# Patient Record
Sex: Female | Born: 1935 | Race: White | Hispanic: No | Smoking: Never smoker
Health system: Southern US, Community
[De-identification: ages and names within clinical notes are randomized; demographics above are authoritative.]

## PROBLEM LIST (undated history)

## (undated) DIAGNOSIS — C73 Malignant neoplasm of thyroid gland: Secondary | ICD-10-CM

## (undated) DIAGNOSIS — I351 Nonrheumatic aortic (valve) insufficiency: Secondary | ICD-10-CM

## (undated) DIAGNOSIS — K219 Gastro-esophageal reflux disease without esophagitis: Secondary | ICD-10-CM

## (undated) DIAGNOSIS — I10 Essential (primary) hypertension: Secondary | ICD-10-CM

## (undated) DIAGNOSIS — A0472 Enterocolitis due to Clostridium difficile, not specified as recurrent: Secondary | ICD-10-CM

## (undated) DIAGNOSIS — E039 Hypothyroidism, unspecified: Secondary | ICD-10-CM

## (undated) DIAGNOSIS — R55 Syncope and collapse: Secondary | ICD-10-CM

## (undated) DIAGNOSIS — F79 Unspecified intellectual disabilities: Secondary | ICD-10-CM

## (undated) DIAGNOSIS — K5090 Crohn's disease, unspecified, without complications: Secondary | ICD-10-CM

## (undated) DIAGNOSIS — B9620 Unspecified Escherichia coli [E. coli] as the cause of diseases classified elsewhere: Secondary | ICD-10-CM

## (undated) DIAGNOSIS — R7881 Bacteremia: Secondary | ICD-10-CM

## (undated) HISTORY — PX: THYROIDECTOMY: SHX17

## (undated) HISTORY — PX: TUBAL LIGATION: SHX77

## (undated) HISTORY — DX: Malignant neoplasm of thyroid gland: C73

## (undated) HISTORY — DX: Enterocolitis due to Clostridium difficile, not specified as recurrent: A04.72

## (undated) HISTORY — DX: Unspecified Escherichia coli (E. coli) as the cause of diseases classified elsewhere: B96.20

## (undated) HISTORY — PX: CHOLECYSTECTOMY: SHX55

## (undated) HISTORY — DX: Hypothyroidism, unspecified: E03.9

## (undated) HISTORY — DX: Unspecified intellectual disabilities: F79

## (undated) HISTORY — DX: Crohn's disease, unspecified, without complications: K50.90

## (undated) HISTORY — DX: Bacteremia: R78.81

## (undated) HISTORY — DX: Gastro-esophageal reflux disease without esophagitis: K21.9

## (undated) HISTORY — PX: OTHER SURGICAL HISTORY: SHX169

## (undated) HISTORY — DX: Nonrheumatic aortic (valve) insufficiency: I35.1

## (undated) HISTORY — PX: APPENDECTOMY: SHX54

## (undated) HISTORY — DX: Syncope and collapse: R55

## (undated) HISTORY — DX: Essential (primary) hypertension: I10

---

## 2000-03-05 ENCOUNTER — Encounter: Admission: RE | Admit: 2000-03-05 | Discharge: 2000-03-05 | Payer: Self-pay | Admitting: Family Medicine

## 2000-03-05 ENCOUNTER — Encounter: Payer: Self-pay | Admitting: Family Medicine

## 2000-04-24 ENCOUNTER — Encounter (INDEPENDENT_AMBULATORY_CARE_PROVIDER_SITE_OTHER): Payer: Self-pay | Admitting: *Deleted

## 2000-04-24 ENCOUNTER — Inpatient Hospital Stay (HOSPITAL_COMMUNITY): Admission: EM | Admit: 2000-04-24 | Discharge: 2000-04-28 | Payer: Self-pay | Admitting: Emergency Medicine

## 2000-04-25 ENCOUNTER — Encounter: Payer: Self-pay | Admitting: Emergency Medicine

## 2000-04-26 ENCOUNTER — Encounter: Payer: Self-pay | Admitting: Endocrinology

## 2000-04-27 ENCOUNTER — Encounter: Payer: Self-pay | Admitting: Gastroenterology

## 2000-07-02 ENCOUNTER — Encounter: Payer: Self-pay | Admitting: General Surgery

## 2000-07-02 ENCOUNTER — Encounter: Admission: RE | Admit: 2000-07-02 | Discharge: 2000-07-02 | Payer: Self-pay | Admitting: General Surgery

## 2000-07-19 ENCOUNTER — Other Ambulatory Visit: Admission: RE | Admit: 2000-07-19 | Discharge: 2000-07-19 | Payer: Self-pay | Admitting: *Deleted

## 2000-09-14 ENCOUNTER — Encounter: Payer: Self-pay | Admitting: General Surgery

## 2000-09-17 ENCOUNTER — Encounter (INDEPENDENT_AMBULATORY_CARE_PROVIDER_SITE_OTHER): Payer: Self-pay | Admitting: Specialist

## 2000-09-17 ENCOUNTER — Observation Stay (HOSPITAL_COMMUNITY): Admission: RE | Admit: 2000-09-17 | Discharge: 2000-09-18 | Payer: Self-pay

## 2000-10-25 ENCOUNTER — Other Ambulatory Visit: Admission: RE | Admit: 2000-10-25 | Discharge: 2000-10-25 | Payer: Self-pay | Admitting: *Deleted

## 2001-03-04 ENCOUNTER — Other Ambulatory Visit: Admission: RE | Admit: 2001-03-04 | Discharge: 2001-03-04 | Payer: Self-pay | Admitting: Obstetrics and Gynecology

## 2001-05-28 ENCOUNTER — Encounter (HOSPITAL_COMMUNITY): Admission: RE | Admit: 2001-05-28 | Discharge: 2001-08-26 | Payer: Self-pay | Admitting: Gastroenterology

## 2001-06-03 ENCOUNTER — Ambulatory Visit (HOSPITAL_COMMUNITY): Admission: RE | Admit: 2001-06-03 | Discharge: 2001-06-03 | Payer: Self-pay | Admitting: Gastroenterology

## 2001-07-04 ENCOUNTER — Other Ambulatory Visit: Admission: RE | Admit: 2001-07-04 | Discharge: 2001-07-04 | Payer: Self-pay | Admitting: Obstetrics and Gynecology

## 2001-12-18 ENCOUNTER — Encounter (HOSPITAL_COMMUNITY): Admission: RE | Admit: 2001-12-18 | Discharge: 2002-03-18 | Payer: Self-pay | Admitting: Internal Medicine

## 2002-02-26 ENCOUNTER — Encounter (HOSPITAL_COMMUNITY): Admission: RE | Admit: 2002-02-26 | Discharge: 2002-05-27 | Payer: Self-pay | Admitting: Gastroenterology

## 2003-09-02 ENCOUNTER — Emergency Department (HOSPITAL_COMMUNITY): Admission: EM | Admit: 2003-09-02 | Discharge: 2003-09-02 | Payer: Self-pay | Admitting: Emergency Medicine

## 2003-12-01 ENCOUNTER — Encounter (HOSPITAL_COMMUNITY): Admission: RE | Admit: 2003-12-01 | Discharge: 2003-12-02 | Payer: Self-pay | Admitting: Internal Medicine

## 2004-08-01 ENCOUNTER — Ambulatory Visit: Payer: Self-pay | Admitting: Internal Medicine

## 2004-08-01 ENCOUNTER — Ambulatory Visit (HOSPITAL_COMMUNITY): Admission: RE | Admit: 2004-08-01 | Discharge: 2004-08-01 | Payer: Self-pay | Admitting: Internal Medicine

## 2004-11-11 ENCOUNTER — Ambulatory Visit: Payer: Self-pay | Admitting: Internal Medicine

## 2004-11-14 ENCOUNTER — Encounter (HOSPITAL_COMMUNITY): Admission: RE | Admit: 2004-11-14 | Discharge: 2005-02-12 | Payer: Self-pay | Admitting: Internal Medicine

## 2007-08-22 DIAGNOSIS — R55 Syncope and collapse: Secondary | ICD-10-CM

## 2007-08-22 HISTORY — DX: Syncope and collapse: R55

## 2007-10-11 ENCOUNTER — Emergency Department: Payer: Self-pay | Admitting: Emergency Medicine

## 2007-10-11 ENCOUNTER — Other Ambulatory Visit: Payer: Self-pay

## 2007-12-16 ENCOUNTER — Inpatient Hospital Stay (HOSPITAL_COMMUNITY): Admission: EM | Admit: 2007-12-16 | Discharge: 2007-12-27 | Payer: Self-pay | Admitting: Emergency Medicine

## 2007-12-16 ENCOUNTER — Ambulatory Visit: Payer: Self-pay | Admitting: Pulmonary Disease

## 2008-01-30 ENCOUNTER — Ambulatory Visit (HOSPITAL_COMMUNITY): Admission: RE | Admit: 2008-01-30 | Discharge: 2008-01-30 | Payer: Self-pay | Admitting: Internal Medicine

## 2008-12-29 ENCOUNTER — Encounter: Payer: Self-pay | Admitting: Cardiovascular Disease

## 2009-02-19 ENCOUNTER — Ambulatory Visit (HOSPITAL_COMMUNITY): Admission: RE | Admit: 2009-02-19 | Discharge: 2009-02-19 | Payer: Self-pay | Admitting: Internal Medicine

## 2009-08-27 DIAGNOSIS — A0472 Enterocolitis due to Clostridium difficile, not specified as recurrent: Secondary | ICD-10-CM

## 2009-08-27 DIAGNOSIS — I10 Essential (primary) hypertension: Secondary | ICD-10-CM | POA: Insufficient documentation

## 2009-08-27 DIAGNOSIS — Z8669 Personal history of other diseases of the nervous system and sense organs: Secondary | ICD-10-CM

## 2009-08-27 DIAGNOSIS — R5381 Other malaise: Secondary | ICD-10-CM | POA: Insufficient documentation

## 2009-08-27 DIAGNOSIS — R1310 Dysphagia, unspecified: Secondary | ICD-10-CM | POA: Insufficient documentation

## 2009-08-28 IMAGING — CR DG CHEST 1V PORT
1 series · 1 of 1 positions shown · non-contrast
Comparison: Earlier exam today at to 0833 hours.

CLINICAL DATA: Sepsis/UTI/pneumonia.  Follow up left lung opacity.

PORTABLE CHEST - 1 VIEW

[view not recorded]
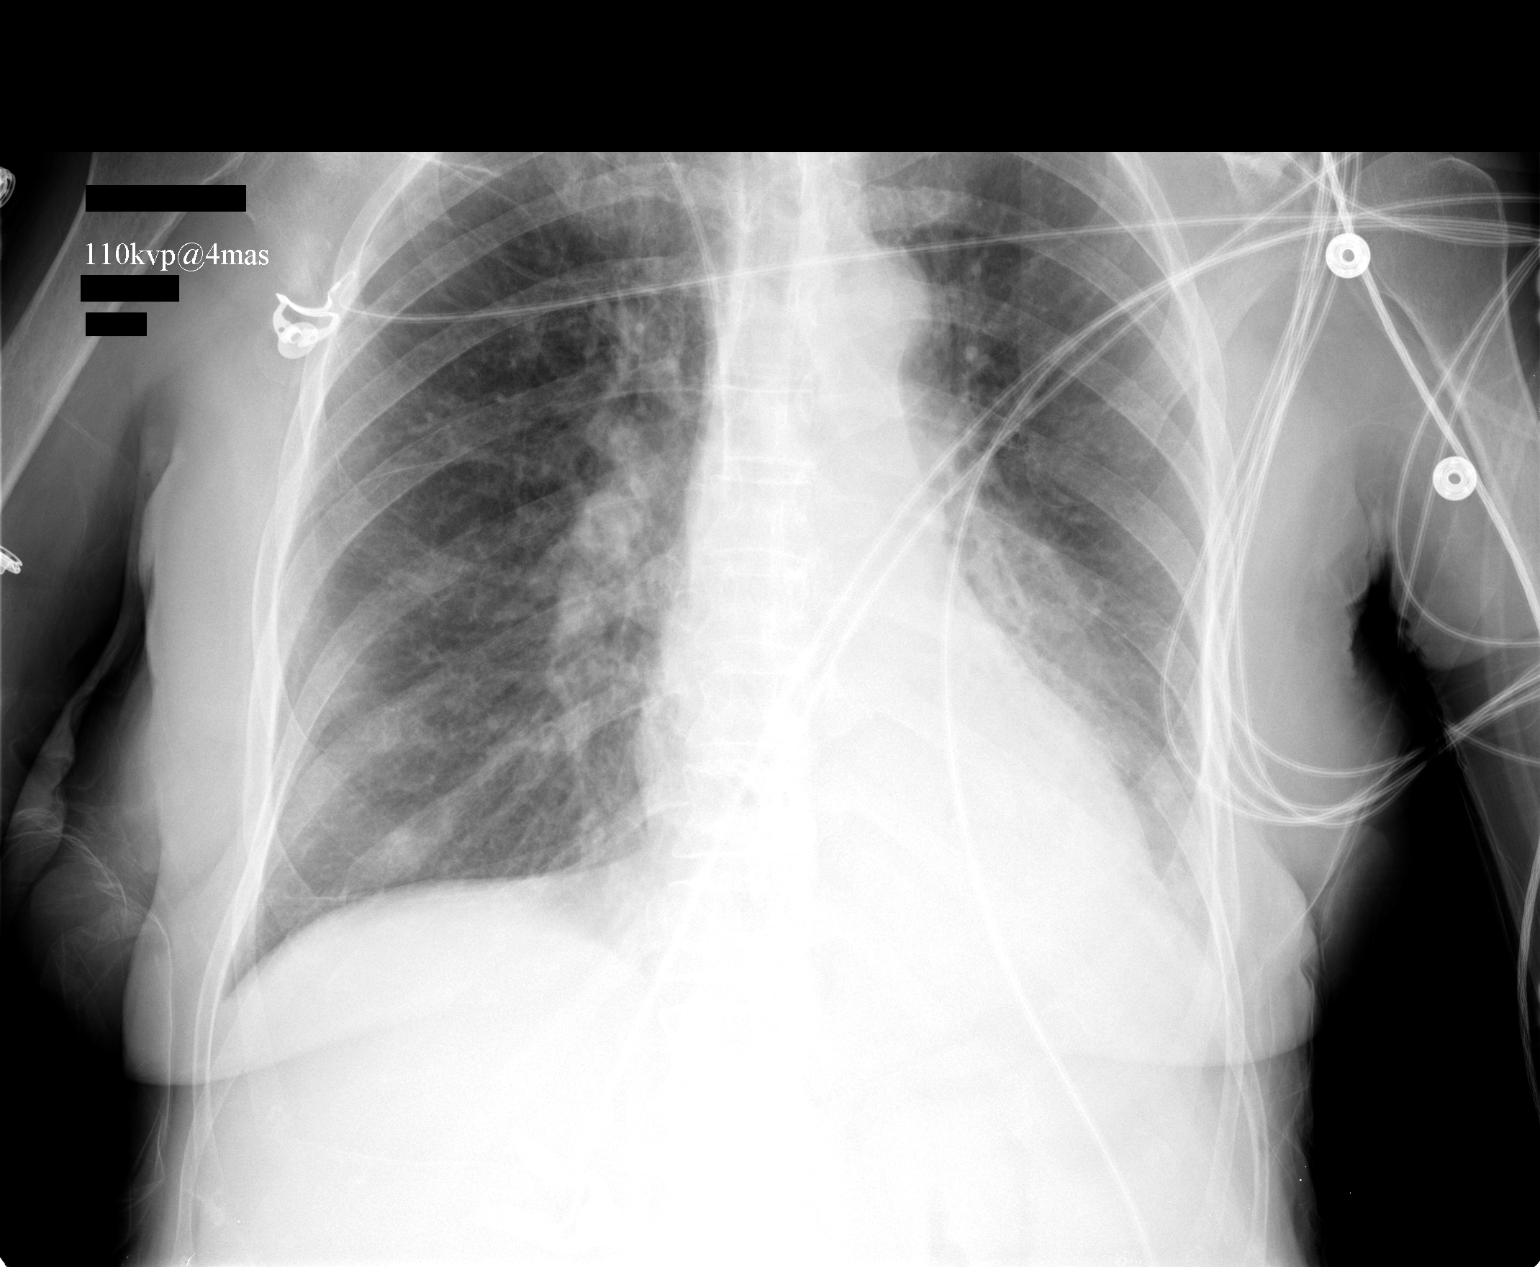

[1 of 1 positions shown; findings below may reference images not displayed]

FINDINGS: Satisfactory ET tube position.  Right jugular CVC is in
the mid SVC.  Suggestion of some improvement in left upper lung
zone aeration.  Density is noted in the left lower lobe compatible
with atelectasis / pneumonic consolidation.  Chronic bronchitic
markings.
IMPRESSION: Left lower lobe atelectasis / consolidation persist.  Some
improvement in left upper lung zone aeration.

## 2009-08-28 IMAGING — CR DG CHEST 1V PORT
1 series · 1 of 1 positions shown · non-contrast
Comparison: 12/16/2007 at 0288 hours

CLINICAL DATA: ET tube repositioned

PORTABLE CHEST - 1 VIEW

[view not recorded]
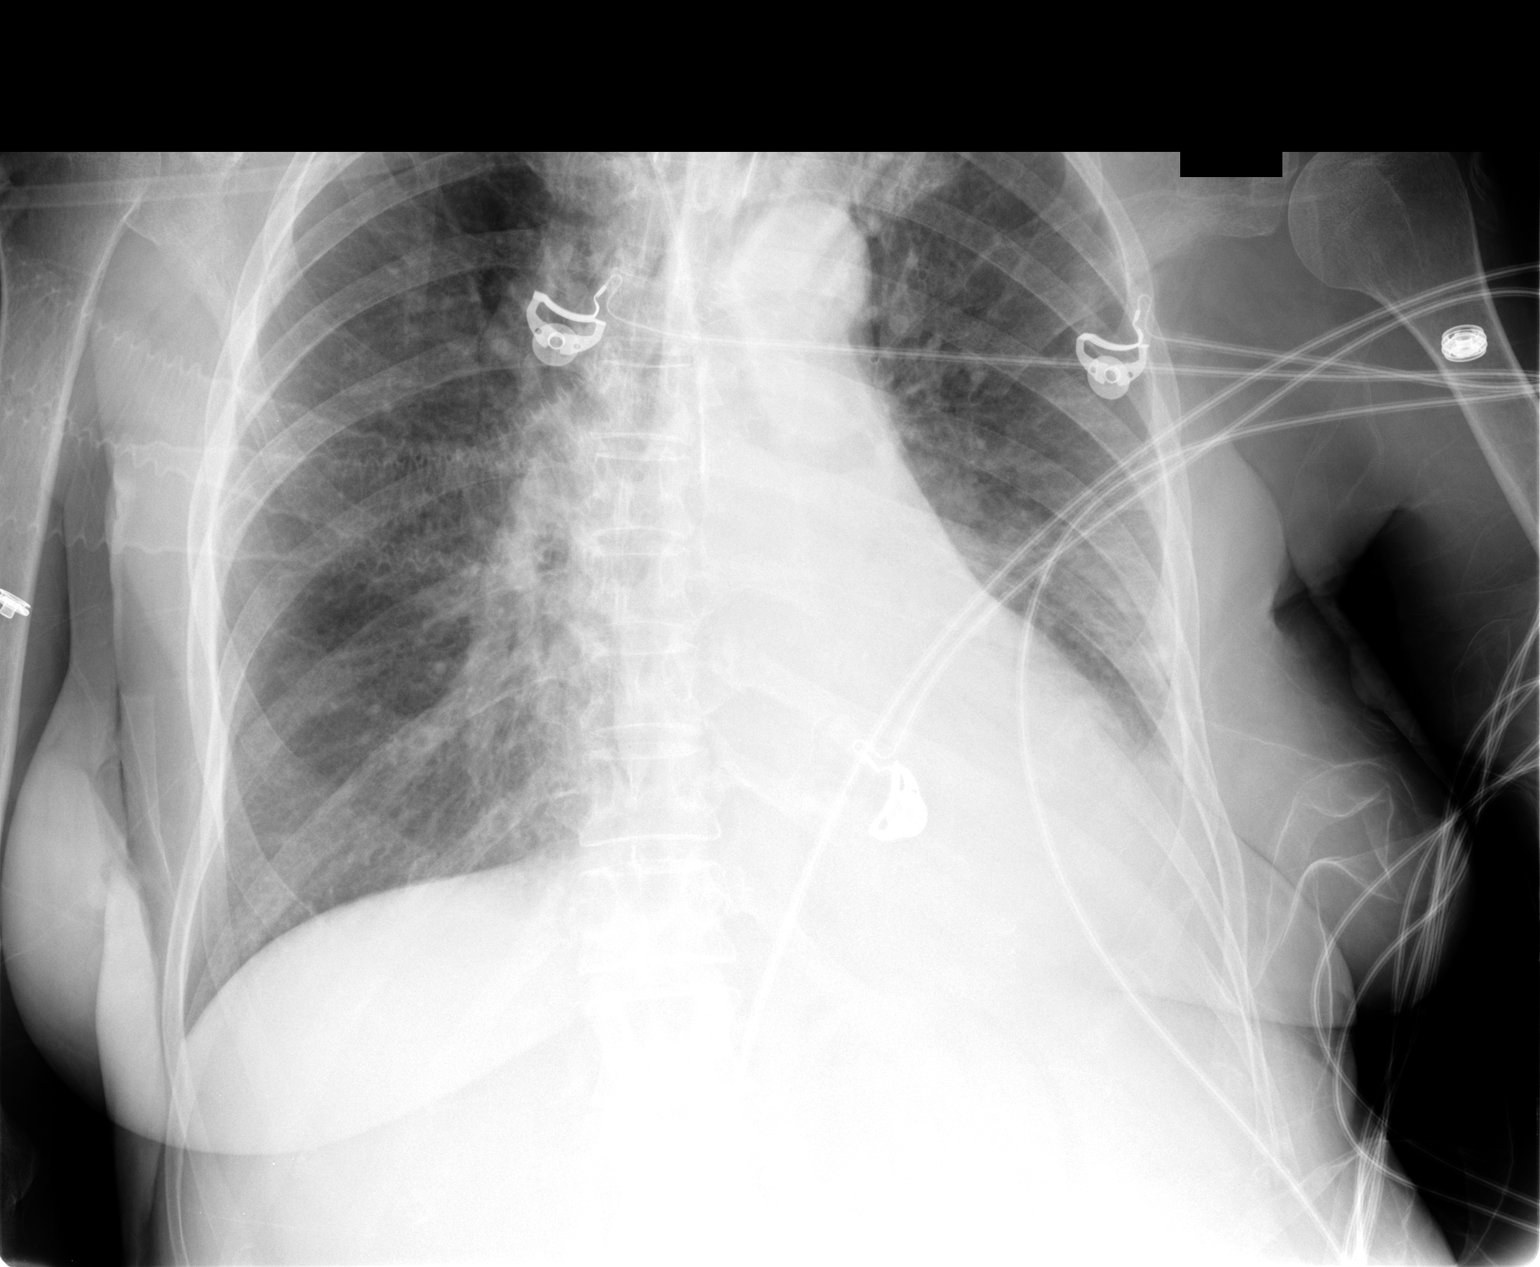

[1 of 1 positions shown; findings below may reference images not displayed]

FINDINGS: ET tube approximately 4 cm above the carina.  The patient
is rotated to the left but there is persistent left lower lobe
atelectasis or atelectatic pneumonia.

No new findings.
IMPRESSION: 1.  ET tube 4 cm above the carina.
2.  Persistent left lower lobe atelectasis or pneumonia.

## 2009-09-02 ENCOUNTER — Ambulatory Visit: Payer: Self-pay | Admitting: Cardiovascular Disease

## 2009-09-02 DIAGNOSIS — R0609 Other forms of dyspnea: Secondary | ICD-10-CM | POA: Insufficient documentation

## 2009-09-02 DIAGNOSIS — R0602 Shortness of breath: Secondary | ICD-10-CM

## 2009-09-02 DIAGNOSIS — R9431 Abnormal electrocardiogram [ECG] [EKG]: Secondary | ICD-10-CM

## 2009-09-02 DIAGNOSIS — R0989 Other specified symptoms and signs involving the circulatory and respiratory systems: Secondary | ICD-10-CM

## 2009-09-17 ENCOUNTER — Ambulatory Visit: Payer: Self-pay | Admitting: Internal Medicine

## 2009-09-17 ENCOUNTER — Ambulatory Visit (HOSPITAL_COMMUNITY): Admission: RE | Admit: 2009-09-17 | Discharge: 2009-09-17 | Payer: Self-pay | Admitting: Cardiovascular Disease

## 2009-09-17 ENCOUNTER — Encounter: Payer: Self-pay | Admitting: Cardiovascular Disease

## 2009-09-17 ENCOUNTER — Ambulatory Visit: Payer: Self-pay

## 2009-09-21 ENCOUNTER — Telehealth: Payer: Self-pay | Admitting: Cardiovascular Disease

## 2009-09-23 ENCOUNTER — Telehealth (INDEPENDENT_AMBULATORY_CARE_PROVIDER_SITE_OTHER): Payer: Self-pay | Admitting: *Deleted

## 2009-09-27 ENCOUNTER — Encounter (HOSPITAL_COMMUNITY): Admission: RE | Admit: 2009-09-27 | Discharge: 2009-11-24 | Payer: Self-pay | Admitting: Pediatrics

## 2009-09-27 ENCOUNTER — Ambulatory Visit: Payer: Self-pay

## 2009-09-27 ENCOUNTER — Ambulatory Visit: Payer: Self-pay | Admitting: Cardiology

## 2009-09-29 ENCOUNTER — Telehealth: Payer: Self-pay | Admitting: Cardiovascular Disease

## 2009-10-01 ENCOUNTER — Ambulatory Visit: Payer: Self-pay | Admitting: Cardiovascular Disease

## 2009-10-28 ENCOUNTER — Telehealth: Payer: Self-pay | Admitting: Cardiovascular Disease

## 2009-11-18 ENCOUNTER — Inpatient Hospital Stay (HOSPITAL_COMMUNITY): Admission: EM | Admit: 2009-11-18 | Discharge: 2009-12-14 | Payer: Self-pay | Admitting: Emergency Medicine

## 2009-12-07 ENCOUNTER — Encounter (INDEPENDENT_AMBULATORY_CARE_PROVIDER_SITE_OTHER): Payer: Self-pay | Admitting: Internal Medicine

## 2010-05-17 ENCOUNTER — Ambulatory Visit (HOSPITAL_COMMUNITY): Admission: RE | Admit: 2010-05-17 | Discharge: 2010-05-17 | Payer: Self-pay | Admitting: Internal Medicine

## 2010-07-01 ENCOUNTER — Encounter: Admission: RE | Admit: 2010-07-01 | Discharge: 2010-07-01 | Payer: Self-pay | Admitting: Obstetrics and Gynecology

## 2010-09-20 NOTE — Progress Notes (Signed)
Summary: dose pt have to come to friday appt  Phone Note From Other Clinic Call back at [phone]   Caller: Daughter/Darlene Reason for Call: Talk to Nurse, Talk to Doctor Summary of Call: pt had myoview Monday 09/27/09 and has an appt with Dr. Clifton James on friday 10/01/09 and she dosen't want to come just to get results she wants her daughter to come. I called Dr. Clifton James and he was not sure what the results of the myoview was and asked me to send a message to message nurse and let them check on results and then we can make the decision as to weather to just call results and cancel appt or what needs to be done. the left above is the daughters work number. Initial call taken by: Omer Jack,  September 29, 2009 3:36 PM  Follow-up for Phone Call        Spoke with pt's daughter. Daughter states pt. would like  to to know if the  appointment with Dr. Clifton James for 10/01/09 was made  to review myoview. I let daugther know appointment was made for F/U Dx dispnea on excertion. MD will go over  recommendations on work up prior surgery procedure. Pt's daughter said pt. is feeling better and will keep appointment. Follow-up by: Ollen Gross, RN, BSN,  September 29, 2009 4:09 PM

## 2010-09-20 NOTE — Assessment & Plan Note (Signed)
Summary: Cardiology Nuclear Study  Nuclear Med Background Indications for Stress Test: Evaluation for Ischemia, Surgical Clearance  Indications Comments: Pending Neck surgery  History: Abnormal EKG, Echo  History Comments: 09/17/09 Echo: EF=40-45%  Symptoms: Chest Pain, DOE, SOB    Nuclear Pre-Procedure Cardiac Risk Factors: Hypertension Caffeine/Decaff Intake: None NPO After: 8:30 AM Lungs: Rhonchi IV 0.9% NS with Angio Cath: 22g     IV Site: (R) AC IV Started by: Irean Hong RN Chest Size (in) 34     Cup Size B     Height (in): 66 Weight (lb): 116 BMI: 18.79 Tech Comments: NPO here due to PEG tube. The patient became very SOB during the infusion. The lungs had increased rhonchi. She was given an Albuterol Neb tx 5.0 mg. The patient had improved greatly after the tx.   Nuclear Med Study 1 or 2 day study:  1 day     Stress Test Type:  Eugenie Birks Reading MD:  Olga Millers, MD     Referring MD:  C.McAlhany Resting Radionuclide:  Technetium 54m Tetrofosmin     Resting Radionuclide Dose:  10.4 mCi  Stress Radionuclide:  Technetium 70m Tetrofosmin     Stress Radionuclide Dose:  33.0 mCi   Stress Protocol   Lexiscan: 0.4 mg   Stress Test Technologist:  Milana Na EMT-P     Nuclear Technologist:  Burna Mortimer Deal RT-N  Rest Procedure  Myocardial perfusion imaging was performed at rest 45 minutes following the intravenous administration of Myoview Technetium 22m Tetrofosmin.  Stress Procedure  The patient received IV Lexiscan 0.4 mg over 15-seconds.  Myoview injected at 30-seconds.  There were no significant changes with infusion.  Quantitative spect images were obtained after a 45 minute delay.  QPS Raw Data Images:  There is interference from nuclear activity from structures below the diaphragm.  This does not affect the ability to read the study. Stress Images:  There is normal uptake in all areas. Rest Images:  Normal homogeneous uptake in all areas of the  myocardium. Subtraction (SDS):  No evidence of ischemia. Transient Ischemic Dilatation:  .79  (Normal <1.22)  Lung/Heart Ratio:  .38  (Normal <0.45)  Quantitative Gated Spect Images QGS EDV:  54 ml QGS ESV:  27 ml QGS EF:  51 % QGS cine images:  Normal wall motion.   Overall Impression  Exercise Capacity: Lexiscan study with no exercise. ECG Impression: No diagnostic ST segment change suggestive of ischemia. Overall Impression: There is no sign of scar or ischemia.  Appended Document: Cardiology Nuclear Study No ischemia. Reviewed with pt today. cdm

## 2010-09-20 NOTE — Letter (Signed)
Summary: [facility]   Imported By: Kassie Mends 11/01/2009 13:19:02  _____________________________________________________________________  External Attachment:    Type:   Image     Comment:   External Document

## 2010-09-20 NOTE — Progress Notes (Signed)
Summary: Nuclear Pre-Procedure  Phone Note Outgoing Call Call back at [phone] Cell   Call placed by: Stanton Kidney, EMT-P,  September 23, 2009 12:52 PM Action Taken: Phone Call Completed Summary of Call: Reviewed information on Myoview Information Sheet (see scanned document for further details).  Spoke with Patient's daughter, Denise Shepherd.     Nuclear Med Background Indications for Stress Test: Evaluation for Ischemia, Surgical Clearance  Indications Comments: Pending Neck surgery  History: Abnormal EKG, Echo  History Comments: 09/17/09 Echo: EF=40-45%  Symptoms: DOE, SOB    Nuclear Pre-Procedure Cardiac Risk Factors: Hypertension Height (in): 66

## 2010-09-20 NOTE — Progress Notes (Signed)
Summary: surgical clearance  Phone Note From Other Clinic   Summary of Call: Per Clydie Braun from [facility] ENT department. Pt needs surgical clearance ofc [phone] fax [phone]. needs it today.  Initial call taken by: Edman Circle,  October 28, 2009 11:46 AM  Follow-up for Phone Call        Dr. Clifton James addressed this and cleared him in his last office note. I s/w Clydie Braun and faxed her this note.  Follow-up by: Duncan Dull, RN, BSN,  October 28, 2009 12:07 PM
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
## 2010-09-20 NOTE — Progress Notes (Signed)
Summary: returning call  Phone Note Call from Patient Call back at [phone]   Caller: Daughter/Darlene Reason for Call: Talk to Nurse Summary of Call: returning call Initial call taken by: Migdalia Dk,  September 21, 2009 8:07 AM  Follow-up for Phone Call        spoke with daughter gave her results of echo and scheduled her mom for an adenosine myoview.  Will fax instructions to her. Dennis Bast, RN, BSN  September 21, 2009 10:05 AM
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
## 2010-11-08 LAB — GLUCOSE, CAPILLARY
Glucose-Capillary: 101 mg/dL — ABNORMAL HIGH (ref 70–99)
Glucose-Capillary: 102 mg/dL — ABNORMAL HIGH (ref 70–99)
Glucose-Capillary: 103 mg/dL — ABNORMAL HIGH (ref 70–99)
Glucose-Capillary: 103 mg/dL — ABNORMAL HIGH (ref 70–99)
Glucose-Capillary: 107 mg/dL — ABNORMAL HIGH (ref 70–99)
Glucose-Capillary: 108 mg/dL — ABNORMAL HIGH (ref 70–99)
Glucose-Capillary: 108 mg/dL — ABNORMAL HIGH (ref 70–99)
Glucose-Capillary: 110 mg/dL — ABNORMAL HIGH (ref 70–99)
Glucose-Capillary: 111 mg/dL — ABNORMAL HIGH (ref 70–99)
Glucose-Capillary: 111 mg/dL — ABNORMAL HIGH (ref 70–99)
Glucose-Capillary: 112 mg/dL — ABNORMAL HIGH (ref 70–99)
Glucose-Capillary: 112 mg/dL — ABNORMAL HIGH (ref 70–99)
Glucose-Capillary: 114 mg/dL — ABNORMAL HIGH (ref 70–99)
Glucose-Capillary: 116 mg/dL — ABNORMAL HIGH (ref 70–99)
Glucose-Capillary: 116 mg/dL — ABNORMAL HIGH (ref 70–99)
Glucose-Capillary: 117 mg/dL — ABNORMAL HIGH (ref 70–99)
Glucose-Capillary: 117 mg/dL — ABNORMAL HIGH (ref 70–99)
Glucose-Capillary: 120 mg/dL — ABNORMAL HIGH (ref 70–99)
Glucose-Capillary: 123 mg/dL — ABNORMAL HIGH (ref 70–99)
Glucose-Capillary: 127 mg/dL — ABNORMAL HIGH (ref 70–99)
Glucose-Capillary: 130 mg/dL — ABNORMAL HIGH (ref 70–99)
Glucose-Capillary: 131 mg/dL — ABNORMAL HIGH (ref 70–99)
Glucose-Capillary: 136 mg/dL — ABNORMAL HIGH (ref 70–99)
Glucose-Capillary: 137 mg/dL — ABNORMAL HIGH (ref 70–99)
Glucose-Capillary: 143 mg/dL — ABNORMAL HIGH (ref 70–99)
Glucose-Capillary: 156 mg/dL — ABNORMAL HIGH (ref 70–99)
Glucose-Capillary: 84 mg/dL (ref 70–99)
Glucose-Capillary: 86 mg/dL (ref 70–99)
Glucose-Capillary: 89 mg/dL (ref 70–99)
Glucose-Capillary: 90 mg/dL (ref 70–99)
Glucose-Capillary: 91 mg/dL (ref 70–99)
Glucose-Capillary: 91 mg/dL (ref 70–99)
Glucose-Capillary: 92 mg/dL (ref 70–99)
Glucose-Capillary: 94 mg/dL (ref 70–99)
Glucose-Capillary: 94 mg/dL (ref 70–99)
Glucose-Capillary: 95 mg/dL (ref 70–99)
Glucose-Capillary: 96 mg/dL (ref 70–99)
Glucose-Capillary: 97 mg/dL (ref 70–99)
Glucose-Capillary: 98 mg/dL (ref 70–99)
Glucose-Capillary: 99 mg/dL (ref 70–99)
Glucose-Capillary: 99 mg/dL (ref 70–99)

## 2010-11-08 LAB — FECAL FAT, QUALITATIVE: Free fatty acids: NORMAL

## 2010-11-08 LAB — CBC
HCT: 33.9 % — ABNORMAL LOW (ref 36.0–46.0)
HCT: 33.9 % — ABNORMAL LOW (ref 36.0–46.0)
HCT: 35 % — ABNORMAL LOW (ref 36.0–46.0)
Hemoglobin: 11.8 g/dL — ABNORMAL LOW (ref 12.0–15.0)
MCHC: 33.4 g/dL (ref 30.0–36.0)
MCHC: 33.7 g/dL (ref 30.0–36.0)
MCV: 92.2 fL (ref 78.0–100.0)
MCV: 92.7 fL (ref 78.0–100.0)
MCV: 92.9 fL (ref 78.0–100.0)
Platelets: 170 10*3/uL (ref 150–400)
Platelets: 193 10*3/uL (ref 150–400)
RBC: 3.66 MIL/uL — ABNORMAL LOW (ref 3.87–5.11)
RBC: 3.81 MIL/uL — ABNORMAL LOW (ref 3.87–5.11)
RDW: 17.2 % — ABNORMAL HIGH (ref 11.5–15.5)
WBC: 4.2 10*3/uL (ref 4.0–10.5)
WBC: 7.3 10*3/uL (ref 4.0–10.5)
WBC: 7.9 10*3/uL (ref 4.0–10.5)

## 2010-11-08 LAB — BASIC METABOLIC PANEL
BUN: 12 mg/dL (ref 6–23)
BUN: 12 mg/dL (ref 6–23)
CO2: 28 mEq/L (ref 19–32)
CO2: 33 mEq/L — ABNORMAL HIGH (ref 19–32)
Calcium: 8.8 mg/dL (ref 8.4–10.5)
Chloride: 102 mEq/L (ref 96–112)
Chloride: 103 mEq/L (ref 96–112)
Creatinine, Ser: 0.87 mg/dL (ref 0.4–1.2)
GFR calc Af Amer: >60 mL/min (ref >60)
GFR calc non Af Amer: >60 mL/min (ref >60)
Glucose, Bld: 91 mg/dL (ref 70–99)
Potassium: 4.2 mEq/L (ref 3.5–5.1)
Sodium: 142 mEq/L (ref 135–145)

## 2010-11-08 LAB — CLOSTRIDIUM DIFFICILE EIA: C difficile Toxins A+B, EIA: NEGATIVE

## 2010-11-09 LAB — DIFFERENTIAL
Basophils Absolute: 0 10*3/uL (ref 0.0–0.1)
Basophils Absolute: 0 10*3/uL (ref 0.0–0.1)
Basophils Relative: 0 % (ref 0–1)
Basophils Relative: 0 % (ref 0–1)
Eosinophils Absolute: 0.2 10*3/uL (ref 0.0–0.7)
Eosinophils Absolute: 0.2 10*3/uL (ref 0.0–0.7)
Eosinophils Relative: 3 % (ref 0–5)
Eosinophils Relative: 3 % (ref 0–5)
Lymphocytes Relative: 10 % — ABNORMAL LOW (ref 12–46)
Lymphs Abs: 0.8 10*3/uL (ref 0.7–4.0)
Monocytes Absolute: 0.3 10*3/uL (ref 0.1–1.0)
Monocytes Absolute: 0.3 10*3/uL (ref 0.1–1.0)
Monocytes Absolute: 0.4 10*3/uL (ref 0.1–1.0)
Monocytes Relative: 6 % (ref 3–12)
Neutro Abs: 4.1 10*3/uL (ref 1.7–7.7)
Neutro Abs: 4.4 10*3/uL (ref 1.7–7.7)
Neutrophils Relative %: 76 % (ref 43–77)
Neutrophils Relative %: 78 % — ABNORMAL HIGH (ref 43–77)

## 2010-11-09 LAB — GLUCOSE, CAPILLARY
Glucose-Capillary: 102 mg/dL — ABNORMAL HIGH (ref 70–99)
Glucose-Capillary: 103 mg/dL — ABNORMAL HIGH (ref 70–99)
Glucose-Capillary: 105 mg/dL — ABNORMAL HIGH (ref 70–99)
Glucose-Capillary: 105 mg/dL — ABNORMAL HIGH (ref 70–99)
Glucose-Capillary: 105 mg/dL — ABNORMAL HIGH (ref 70–99)
Glucose-Capillary: 116 mg/dL — ABNORMAL HIGH (ref 70–99)
Glucose-Capillary: 117 mg/dL — ABNORMAL HIGH (ref 70–99)
Glucose-Capillary: 119 mg/dL — ABNORMAL HIGH (ref 70–99)
Glucose-Capillary: 121 mg/dL — ABNORMAL HIGH (ref 70–99)
Glucose-Capillary: 123 mg/dL — ABNORMAL HIGH (ref 70–99)
Glucose-Capillary: 125 mg/dL — ABNORMAL HIGH (ref 70–99)
Glucose-Capillary: 126 mg/dL — ABNORMAL HIGH (ref 70–99)
Glucose-Capillary: 127 mg/dL — ABNORMAL HIGH (ref 70–99)
Glucose-Capillary: 127 mg/dL — ABNORMAL HIGH (ref 70–99)
Glucose-Capillary: 128 mg/dL — ABNORMAL HIGH (ref 70–99)
Glucose-Capillary: 128 mg/dL — ABNORMAL HIGH (ref 70–99)
Glucose-Capillary: 129 mg/dL — ABNORMAL HIGH (ref 70–99)
Glucose-Capillary: 129 mg/dL — ABNORMAL HIGH (ref 70–99)
Glucose-Capillary: 130 mg/dL — ABNORMAL HIGH (ref 70–99)
Glucose-Capillary: 131 mg/dL — ABNORMAL HIGH (ref 70–99)
Glucose-Capillary: 136 mg/dL — ABNORMAL HIGH (ref 70–99)
Glucose-Capillary: 138 mg/dL — ABNORMAL HIGH (ref 70–99)
Glucose-Capillary: 140 mg/dL — ABNORMAL HIGH (ref 70–99)
Glucose-Capillary: 140 mg/dL — ABNORMAL HIGH (ref 70–99)
Glucose-Capillary: 147 mg/dL — ABNORMAL HIGH (ref 70–99)
Glucose-Capillary: 148 mg/dL — ABNORMAL HIGH (ref 70–99)
Glucose-Capillary: 149 mg/dL — ABNORMAL HIGH (ref 70–99)
Glucose-Capillary: 151 mg/dL — ABNORMAL HIGH (ref 70–99)
Glucose-Capillary: 73 mg/dL (ref 70–99)
Glucose-Capillary: 75 mg/dL (ref 70–99)
Glucose-Capillary: 77 mg/dL (ref 70–99)
Glucose-Capillary: 79 mg/dL (ref 70–99)
Glucose-Capillary: 80 mg/dL (ref 70–99)
Glucose-Capillary: 83 mg/dL (ref 70–99)
Glucose-Capillary: 86 mg/dL (ref 70–99)
Glucose-Capillary: 87 mg/dL (ref 70–99)
Glucose-Capillary: 89 mg/dL (ref 70–99)
Glucose-Capillary: 90 mg/dL (ref 70–99)
Glucose-Capillary: 92 mg/dL (ref 70–99)
Glucose-Capillary: 93 mg/dL (ref 70–99)
Glucose-Capillary: 96 mg/dL (ref 70–99)

## 2010-11-09 LAB — CBC
HCT: 30.7 % — ABNORMAL LOW (ref 36.0–46.0)
HCT: 32.7 % — ABNORMAL LOW (ref 36.0–46.0)
HCT: 33.2 % — ABNORMAL LOW (ref 36.0–46.0)
HCT: 35.8 % — ABNORMAL LOW (ref 36.0–46.0)
Hemoglobin: 10.2 g/dL — ABNORMAL LOW (ref 12.0–15.0)
Hemoglobin: 10.8 g/dL — ABNORMAL LOW (ref 12.0–15.0)
Hemoglobin: 10.9 g/dL — ABNORMAL LOW (ref 12.0–15.0)
Hemoglobin: 12 g/dL (ref 12.0–15.0)
Hemoglobin: 9.8 g/dL — ABNORMAL LOW (ref 12.0–15.0)
MCHC: 32.7 g/dL (ref 30.0–36.0)
MCHC: 33.4 g/dL (ref 30.0–36.0)
MCV: 91 fL (ref 78.0–100.0)
MCV: 92 fL (ref 78.0–100.0)
MCV: 92.1 fL (ref 78.0–100.0)
MCV: 92.5 fL (ref 78.0–100.0)
RBC: 3.23 MIL/uL — ABNORMAL LOW (ref 3.87–5.11)
RBC: 3.54 MIL/uL — ABNORMAL LOW (ref 3.87–5.11)
RBC: 3.57 MIL/uL — ABNORMAL LOW (ref 3.87–5.11)
RBC: 3.61 MIL/uL — ABNORMAL LOW (ref 3.87–5.11)
RBC: 3.87 MIL/uL (ref 3.87–5.11)
RDW: 14.5 % (ref 11.5–15.5)
RDW: 14.7 % (ref 11.5–15.5)
RDW: 15.1 % (ref 11.5–15.5)
RDW: 16.3 % — ABNORMAL HIGH (ref 11.5–15.5)
WBC: 10.5 10*3/uL (ref 4.0–10.5)
WBC: 5.5 10*3/uL (ref 4.0–10.5)
WBC: 6 10*3/uL (ref 4.0–10.5)
WBC: 7.1 10*3/uL (ref 4.0–10.5)

## 2010-11-09 LAB — CULTURE, BLOOD (ROUTINE X 2)

## 2010-11-09 LAB — BASIC METABOLIC PANEL
BUN: 12 mg/dL (ref 6–23)
CO2: 28 mEq/L (ref 19–32)
CO2: 28 mEq/L (ref 19–32)
CO2: 29 mEq/L (ref 19–32)
CO2: 30 mEq/L (ref 19–32)
Calcium: 8.4 mg/dL (ref 8.4–10.5)
Calcium: 8.6 mg/dL (ref 8.4–10.5)
Calcium: 8.6 mg/dL (ref 8.4–10.5)
Chloride: 104 mEq/L (ref 96–112)
Chloride: 106 mEq/L (ref 96–112)
Chloride: 106 mEq/L (ref 96–112)
Chloride: 108 mEq/L (ref 96–112)
Creatinine, Ser: 0.62 mg/dL (ref 0.4–1.2)
Creatinine, Ser: 0.68 mg/dL (ref 0.4–1.2)
Creatinine, Ser: 0.71 mg/dL (ref 0.4–1.2)
GFR calc Af Amer: >60 mL/min (ref >60)
GFR calc Af Amer: >60 mL/min (ref >60)
GFR calc Af Amer: >60 mL/min (ref >60)
GFR calc Af Amer: >60 mL/min (ref >60)
GFR calc Af Amer: >60 mL/min (ref >60)
GFR calc non Af Amer: >60 mL/min (ref >60)
GFR calc non Af Amer: >60 mL/min (ref >60)
GFR calc non Af Amer: >60 mL/min (ref >60)
GFR calc non Af Amer: >60 mL/min (ref >60)
Glucose, Bld: 114 mg/dL — ABNORMAL HIGH (ref 70–99)
Glucose, Bld: 131 mg/dL — ABNORMAL HIGH (ref 70–99)
Glucose, Bld: 144 mg/dL — ABNORMAL HIGH (ref 70–99)
Potassium: 3.9 mEq/L (ref 3.5–5.1)
Potassium: 4.1 mEq/L (ref 3.5–5.1)
Potassium: 4.2 mEq/L (ref 3.5–5.1)
Potassium: 4.5 mEq/L (ref 3.5–5.1)
Potassium: 4.6 mEq/L (ref 3.5–5.1)
Sodium: 138 mEq/L (ref 135–145)
Sodium: 138 mEq/L (ref 135–145)
Sodium: 140 mEq/L (ref 135–145)
Sodium: 142 mEq/L (ref 135–145)
Sodium: 145 mEq/L (ref 135–145)

## 2010-11-09 LAB — CLOSTRIDIUM DIFFICILE EIA

## 2010-11-09 LAB — FECAL LACTOFERRIN, QUANT: Fecal Lactoferrin: POSITIVE

## 2010-11-09 LAB — OVA AND PARASITE EXAMINATION

## 2010-11-09 LAB — STOOL CULTURE

## 2010-11-09 LAB — T4, FREE: Free T4: 1.1 ng/dL (ref 0.80–1.80)

## 2010-11-11 LAB — GLUCOSE, CAPILLARY: Glucose-Capillary: 114 mg/dL — ABNORMAL HIGH (ref 70–99)

## 2010-11-11 LAB — URINE MICROSCOPIC-ADD ON

## 2010-11-11 LAB — URINALYSIS, ROUTINE W REFLEX MICROSCOPIC
Bilirubin Urine: NEGATIVE
Specific Gravity, Urine: 1.018 (ref 1.005–1.030)
pH: 5.5 (ref 5.0–8.0)

## 2010-11-11 LAB — CBC
MCV: 91.5 fL (ref 78.0–100.0)
Platelets: 232 10*3/uL (ref 150–400)
RBC: 3.93 MIL/uL (ref 3.87–5.11)
WBC: 23.2 10*3/uL — ABNORMAL HIGH (ref 4.0–10.5)

## 2010-11-11 LAB — CULTURE, BLOOD (ROUTINE X 2)

## 2010-11-11 LAB — URINE CULTURE: Colony Count: >=100000

## 2010-11-11 LAB — DIFFERENTIAL
Basophils Absolute: 0 10*3/uL (ref 0.0–0.1)
Eosinophils Absolute: 0 10*3/uL (ref 0.0–0.7)
Eosinophils Relative: 0 % (ref 0–5)
Lymphocytes Relative: 1 % — ABNORMAL LOW (ref 12–46)
Monocytes Absolute: 0.4 10*3/uL (ref 0.1–1.0)

## 2010-11-11 LAB — COMPREHENSIVE METABOLIC PANEL
ALT: 17 U/L (ref 0–35)
AST: 21 U/L (ref 0–37)
Albumin: 3 g/dL — ABNORMAL LOW (ref 3.5–5.2)
CO2: 28 mEq/L (ref 19–32)
Chloride: 101 mEq/L (ref 96–112)
Creatinine, Ser: 0.9 mg/dL (ref 0.4–1.2)
GFR calc Af Amer: >60 mL/min (ref >60)
Potassium: 3.7 mEq/L (ref 3.5–5.1)
Sodium: 137 mEq/L (ref 135–145)
Total Bilirubin: 0.7 mg/dL (ref 0.3–1.2)
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
## 2011-01-06 NOTE — Procedures (Signed)
[place]  Patient:    Denise Shepherd, Denise Shepherd                   MRN: [id number] Proc. Date: 04/26/00 Adm. Date:  [id number] Attending:  Justine Null CC:         Laurita Quint, M.D.                           Procedure Report  PROCEDURE:  Colonoscopy.  ENDOSCOPIST:  Venita Lick. Pleas Koch., M.D.  INDICATIONS:  A 77 year old white female hospitalized with hematochezia, severe anemia, and a history of Crohns disease noted incidentally on appendectomy.  PHYSICAL EXAMINATION:  CHEST:  Clear to auscultation and percussion.  CARDIAC:  A regular rate and rhythm without murmurs.  NEUROLOGIC:  Alert and oriented x 3.  ANESTHESIA:  Fentanyl 75 mcg IV, Versed 8 mg IV.  MONITORING:  Automated blood pressure monitor, pulse oximeter, and cardiac monitor.  Low-flow oxygen was given by nasal cannula throughout the procedure. The procedure was well-tolerated, with no immediate complications.  DESCRIPTION OF PROCEDURE:  After the nature of the procedure was discussed with the patient, including a discussion of its risks, benefits, and alternatives, she consented to proceed.  She was then comfortably sedated in the left lateral decubitus position.  A digital rectal examination was unremarkable.  The Olympus pediatric video colonoscope was inserted in the rectal vault.  Air was insufflated, and the colonoscope was advanced to the sigmoid colon.  The sigmoid colon was very tortuous with thickened haustral folds and diverticulosis noted.  There was moderate difficulty in advancing around the sigmoid colon.  The remainder of the procedure was unremarkable. The colonoscope was advanced to the cecum.  The bowel preparation was good throughout.  The cecum was identified by the appendiceal orifice and ileocecal valve orifice.  The ileocecal valve orifice appeared somewhat fibrotic and slightly strictured.  I was able to enter the distal 5.0 cm of the  terminal ileum on one occasion.  I could not re-enter it despite several additional attempts.  The mucosa did appear to be slightly erythematous and there was a question of stricturing in the distal terminal ileum.  I was unable to re-enter it to obtain biopsies.  On slow withdrawal of the colonoscope, the remainder of the cecum appeared unremarkable.  The ascending colon, hepatic flexure, transverse colon, splenic flexure, and descending colon were all unremarkable.  Diverticulosis was again noted in the tortuous sigmoid colon. The proximal rectum and a retroflexed view of the distal rectum were all unremarkable.  The colon was decompressed and the colonoscope was withdrawn from the patient.  IMPRESSION: 1. Colonoscopy to the terminal ileum. 2. Sigmoid colon diverticulosis. 3. Abnormal-appearing and slightly stenotic distal terminal ileum.  RECOMMENDATIONS: 1. Rule out Crohns ileitis. 2. Proceed with a small bowel follow-through. 3. Begin iron replacement. 4. Further plans to follow. DD:  04/26/00 TD:  04/26/00 Job: [id number]

## 2011-01-06 NOTE — Op Note (Signed)
[facility]  Patient:    Denise Shepherd, Denise Shepherd                   MRN: [id number] Proc. Date: 09/17/00 Adm. Date:  [id number] Attending:  Caleen Essex                           Operative Report  PREOPERATIVE DIAGNOSES:  Strong family history of ovarian cancer requesting prophylactic oophorectomy.  POSTOPERATIVE DIAGNOSES:  Strong family history of ovarian cancer requesting prophylactic oophorectomy.  PROCEDURE:  Laparoscopic bilateral salpingo-oophorectomy.  SURGEON:  Marina Gravel, M.D.  ASSISTANT:  Chevis Pretty, M.D.  ANESTHESIA:  General.  FINDINGS:  Examination under anesthesia normal uterus and ovaries. Rectovaginal exam confirmed these findings.  OPERATIVE FINDINGS:  Atrophic appearing ovaries, normal appearing uterus and tubes.  ESTIMATED BLOOD LOSS:  50 cc.  COMPLICATIONS:  None.  INDICATIONS FOR PROCEDURE:  The patient has two first degree relatives with ovarian cancer. She was planning for a laparoscopic cholecystectomy and requested prophylactic oophorectomy at the same time. The risks were discussed. The patient wished to proceed.  DESCRIPTION OF PROCEDURE:  The patient was taken to the operating room and general anesthesia obtained. She was examined under anesthesia with the above findings noted. She was prepped and draped in the standard fashion and a Foley catheter inserted in her bladder.  Dr. Carolynne Edouard started the procedure by placing his ports for his laparoscopic choec to include supraumbilical and three right upper quadrant ports. Please see his operative note for details in this regard.  After pneumoperitoneum with CO2 gas, the upper abdomen and pelvis were inspected. The upper abdomen appeared amendable for laparoscopic cholecystectomy and Dr. Carolynne Edouard deemed his exposure acceptable. We then investigated the pelvis. Above findings noted. There were some mild omental adhesions to the anterior abdominal wall just below the umbilicus.  These were taken down with monopolar scissors. Hemostasis obtained.  The patient was placed in Trendelenburg position and the bowel mobilized superiorly with blunt instrument. The left tube and ovary were replaced on traction and the left ureter identified. It was noted to be well away from the tube and ovary.  The left IP ligament was then grasped with the tripolar scissors. It was triple burned with bipolar cautery. It was then incised sharply. The broad ligament just inferior to the tube and ovary was then dissected with the tripolar instrument. Attention was kept to the course of the ureter, and we stayed well away from this area. The dissection was carried to the level of the uterine cornu. The uterus tube and uterine ovarian ligament were then cauterized with tripolar and dissected sharply. The left tube and ovary were removed from the abdomen through the umbilical Hasson cannula port.  A similar procedure was repeated on the right side.  The line of dissection on each side was inspected and was hemostatic. My portion of the case was then terminated. The patient was left in the care of Dr. Carolynne Edouard to initiate his laparoscopic cholecystectomy.  The patient tolerated the procedure well and there were no complications. DD:  09/17/00 TD:  09/17/00 Job: [id number]

## 2011-01-06 NOTE — Op Note (Signed)
[facility]  Patient:    Denise Shepherd, Denise Shepherd                   MRN: [id number] Proc. Date: 09/17/00 Adm. Date:  [id number] Attending:  Caleen Essex                           Operative Report  PREOPERATIVE DIAGNOSES:  Symptomatic cholelithiasis.  POSTOPERATIVE DIAGNOSES:  Symptomatic cholelithiasis.  PROCEDURE:  Laparoscopic cholecystectomy.  SURGEON:  Chevis Pretty, M.D.  ASSISTANT:  Milus Mallick, M.D.  ANESTHESIA:  General endotracheal.  DESCRIPTION OF PROCEDURE:  After informed consent was obtained, the patient was brought to the operating room and placed in a supine position on the operating table.  After adequate induction of general anesthesia, the patients abdomen was prepped with Betadine and draped in the usual sterile manner.  A small transverse supraumbilical incision was made with a #15 blade knife.  This incision was carried down through the subcutaneous tissue using blunt dissection with a Kelly clamp and [employer] retractors until the linea alba was encountered.  The linea alba was incised with the #15 blade knife, and each side was grasped with Kocher clamps and elevated.  The preperitoneal space was then probed bluntly with a hemostat until the peritoneum was opened bluntly and access was gained to the abdominal cavity.  A finger was inserted to this hole and the anterior abdominal wall palpated.  There seemed to be some adhesions inferior to the incision but none superior.  A 0 Vicryl pursestring stitch was placed in the fascia around this hole, and the Hasson cannula was placed through this hole into the abdomen.  The site was anchored with the previous placed pursestring stitch, and the abdomen was insufflated with carbon dioxide.  Laparoscope was inserted through the Hasson cannula, and the liver edge and gallbladder were readily identifiable.  At this point, Dr. Delia Heady was present to do remove Denise Shepherd ovaries, which he  would do after I had placed the rest of our ports for the gallbladder portion of the procedure.  An small transverse upper midline incision was made with the #15 blade knife.  A 10 mm port was placed through this incision bluntly into the abdominal cavity under direct vision.  Two smaller incisions were made laterally on the right side of the abdomen below the costal margin, and two 5 mm ports were placed through these incisions, again under direct vision bluntly into the abdomen.  Once this was accomplished, a pair of laparoscopic scissors was placed through one of the lateral 5 mm ports and used to take down the adhesions in the lower abdomen to the anterior abdominal wall, using a combination of blunt dissection as well as sharp dissection with the scissors and Bovie electrocautery.  There did not appear to be any bowel near these adhesions, and all of it appeared to be omental fat.  The abdomen was then inspected once these adhesions were down, and there did not appear to be any damage to any bowel or other viscera.  This allowed good visualization of the pelvis.  The patient was then placed in a Trendelenburg position, and one more lateral, in the left lower quadrant, 5 mm port was placed again under direct vision with the laparoscope.  This port was placed by Dr. Delia Heady. His portion of the procedure will be dictated separately.  When this portion of the case was completed,  the attention was then turned back to the gallbladder.  A blunt grasper was placed through the lateral-most 5 mm port and used to grasp the dome of the gallbladder and elevate it anteriorly and superiorly up over the liver edge.  Another blunt grasper was placed through the other 5 mm port and used to retract on the body and neck of the gallbladder.  A [place] dissector was placed through the upper midline port and used to take down some of the fatty adhesions to the body of the gallbladder, using a combination  of blunt dissection as well as sharp dissection using the Bovie electrocautery with the [employer].  Once this was complete, the peritoneal reflection over top of the gallbladder neck was opened using the Bovie electrocautery, and this area was then bluntly dissected until the gallbladder neck cystic duct junction was identified.  The gallbladder neck cystic duct junction was then bluntly dissected circumferentially until it could be easily identified.  Care was taken to keep the common duct medial to this dissection.  Three clips were then placed proximally and one distally on the cystic duct, and the cystic duct was divided between the two with the laparoscopic scissors.  Two smaller vessels were then identified posterior to this and each was dissected circumferentially with the [employer].  Two clips were placed proximally and one distally, and each one was then divided between the two with the laparoscopic scissors.  The laparoscopic cautery was then used to remove the gallbladder from the liver bed.  Prior to completely detaching the gallbladder from the liver bed, the liver bed was inspected, and several small oozing points were coagulated with the electrocautery.  Hemostasis was achieved, and the rest of the gallbladder was removed from the liver bed with the hook electrocautery.  The laparoscope was then moved to the upper midline port, and a gallbladder grasper was placed through the Hasson cannula and grasped the neck of the gallbladder.  The gallbladder was then removed with the Hasson cannula through the supraumbilical port site without difficulty. The fascia was then closed with the previously placed 0 Vicryl pursestring stitch.  The liver bed and abdomen was then irrigated with copious amounts of saline until the effluent was clear.  All of the wounds were hemostatic.  Each of the ports were then removed under direct vision, and all were hemostatic. The skin  incisions were then closed with interrupted 4-0 Monocryl subcuticular stitches.  Benzoin and Steri-Strips were applied.  The patient tolerated the procedure well.  At the end of the case, all sponge, needle and instrument  counts were correct.  The patient was awakened and taken to the recovery room in stable condition.  Prior to ending the case, each of the incisions were infiltrated with 0.25% Marcaine. DD:  09/17/00 TD:  09/17/00 Job: [id number]

## 2011-01-06 NOTE — H&P (Signed)
[facility]  Patient:    Denise Shepherd, Denise Shepherd                   MRN: [id number] Adm. Date:  [id number] Attending:  Caleen Essex                         History and Physical  PREOPERATIVE DIAGNOSES: 1. Strong family history of ovarian cancer. 2. Plans for laparoscopic cholecystectomy. 3. Desires prophylactic oophorectomy.  HISTORY OF PRESENT ILLNESS:  Denise Shepherd is a 77 year old white female who is under the care of Dr. Chevis Pretty for cholelithiasis.  She has plans today for laparoscopic cholecystectomy.  She has a strong family history of ovarian cancer in that her mother and sister have both died from this disease.  She is requesting prophylactic oophorectomy coincidentally with her laparoscopic cholecystectomy.  Please see the full history and physical dictation note from Dr. Carolynne Edouard for complete details with regards to his plans.  PAST MEDICAL HISTORY:  Gallstones, shingles, history of GI bleed, possible Crohns disease.  PAST SURGICAL HISTORY:  Tubal ligation, appendectomy, removal of benign breast mass.  MEDICATIONS:  Iron replacement, Ultram, Tylenol.  ALLERGIES:  None.  SOCIAL HISTORY:  No alcohol, tobacco, or other drugs.  REVIEW OF SYSTEMS:  Occasional difficulty breathing and heartburn.  Some hot flashes.  Otherwise, review of systems is within normal limits.  FAMILY HISTORY:  Reviewed as outlined above.  PHYSICAL EXAMINATION:  VITAL SIGNS:  Blood pressure 158/86, weight 145 pounds, height 5 feet 5 inches.  GENERAL:  The patient is alert and oriented, in no acute distress.  SKIN:  Warm, dry, no lesions.  PELVIC:  Normal external female genitalia.  Vagina, cervix:  Normal.  Uterus: Normal size.  No adnexal masses or tenderness.  Rectovaginal examination confirms.  ASSESSMENT:  Strong family history of ovarian cancer.  Desires prophylactic oophorectomy coincidentally with laparoscopic cholecystectomy.  Operative risks were  discussed including infection, bleeding, damage to bowel, bladder, or surrounding organs.  All questions answered.  In addition, no guarantee that this would actually prevent ovarian cancer or prevent primary peritoneal carcinoma ptosis.  All questions answered.  Patient wished to proceed.  Arrangements have been made for surgery today. DD:  09/17/00 TD:  09/17/00 Job: [id number]

## 2011-01-06 NOTE — Procedures (Signed)
[place]  Patient:    Denise Shepherd, Denise Shepherd                   MRN: [id number] Proc. Date: 04/26/00 Adm. Date:  [id number] Attending:  Justine Null CC:         Laurita Quint, M.D.                           Procedure Report  PROCEDURE:  Esophagogastroduodenoscopy with biopsies.  ENDOSCOPIST:  Venita Lick. Pleas Koch., M.D.  INDICATIONS:  This 77 year old white female hospitalized with hematochezia, severe anemia, and a history of Crohns disease, noted incidentally on an appendectomy in the distant past.  PHYSICAL EXAMINATION:  CHEST:  Clear to auscultation.  CARDIAC:  A regular rate and rhythm, without murmurs.  NEUROLOGIC:  Alert and oriented x 3.  ANESTHESIA:  Fentanyl 25 mcg IV, Versed 2 mg IV, and pharyngeal cetacaine spray.  MONITORING:  Automated blood pressure monitor, pulse oximeter, and cardiac monitor.  Low-flow oxygen was given by nasal cannula throughout the procedure.  The procedure was well-tolerated, with no immediate complications.  DESCRIPTION OF PROCEDURE:  After the nature of the procedure was discussed with the patient, including a discussion of its risks, benefits, and alternatives, she consented to proceed.  She was then comfortably sedated in the left lateral decubitus position.  The Olympus video endoscope was inserted into the posterior pharynx and the esophagus was intubated under direct visualization.  The esophagus had a normal endoscopic appearance.  The Z-line measured approximately 40.0 cm from the incisors.  Examination of the gastric body revealed no abnormalities.  A retroflexed view of the angularis, lesser curvature, cardia, and fundus revealed no abnormalities.  The gastric antrum, gastric pylorus, duodenal bulb, and descending duodenum all appeared normal. Random biopsies were obtained of normal-appearing descending duodenal mucosa, to rule out celiac sprue disease.  The stomach was decompressed.   The endoscope was withdrawn from the patient.  IMPRESSION:  Normal upper endoscopy.  RECOMMENDATIONS: 1. Await biopsies. 2. Proceed with a colonoscopy. DD:  04/26/00 TD:  04/26/00 Job: [id number]

## 2011-02-21 ENCOUNTER — Encounter: Payer: Self-pay | Admitting: Cardiovascular Disease

## 2011-05-16 LAB — BLOOD GAS, ARTERIAL
Acid-base deficit: 4 — ABNORMAL HIGH
Drawn by: 229971
FIO2: 0.65
FIO2: 1
PEEP: 5
RATE: 14
pCO2 arterial: 36.5
pCO2 arterial: 39.1
pH, Arterial: 7.365
pH, Arterial: 7.369
pO2, Arterial: 218 — ABNORMAL HIGH
pO2, Arterial: 259 — ABNORMAL HIGH

## 2011-05-16 LAB — BASIC METABOLIC PANEL
BUN: 28 — ABNORMAL HIGH
BUN: 47 — ABNORMAL HIGH
CO2: 21
CO2: 22
CO2: 23
Calcium: 8.3 — ABNORMAL LOW
Calcium: 8.6
Calcium: 8.6
Calcium: 8.8
Chloride: 106
Chloride: 124 — ABNORMAL HIGH
Creatinine, Ser: 0.91
Creatinine, Ser: 1.41 — ABNORMAL HIGH
Creatinine, Ser: 1.58 — ABNORMAL HIGH
GFR calc Af Amer: 44 — ABNORMAL LOW
GFR calc Af Amer: >60
GFR calc non Af Amer: 32 — ABNORMAL LOW
GFR calc non Af Amer: 37 — ABNORMAL LOW
GFR calc non Af Amer: >60
GFR calc non Af Amer: >60
Glucose, Bld: 125 — ABNORMAL HIGH
Glucose, Bld: 85
Potassium: 3.4 — ABNORMAL LOW
Sodium: 138
Sodium: 145
Sodium: 149 — ABNORMAL HIGH

## 2011-05-16 LAB — URINALYSIS, ROUTINE W REFLEX MICROSCOPIC
Nitrite: NEGATIVE
Specific Gravity, Urine: 1.012
pH: 6.5

## 2011-05-16 LAB — CBC
HCT: 23.5 — ABNORMAL LOW
Hemoglobin: 7.7 — CL
Hemoglobin: 7.9 — CL
MCHC: 32.4
MCHC: 32.4
MCHC: 33.3
MCV: 83.7
MCV: 85.3
Platelets: 67 — ABNORMAL LOW
Platelets: 72 — ABNORMAL LOW
Platelets: 90 — ABNORMAL LOW
RBC: 2.63 — ABNORMAL LOW
RBC: 2.8 — ABNORMAL LOW
RBC: 3.22 — ABNORMAL LOW
RBC: 3.26 — ABNORMAL LOW
RDW: 19.3 — ABNORMAL HIGH
RDW: 20.6 — ABNORMAL HIGH
WBC: 14.5 — ABNORMAL HIGH
WBC: 16.2 — ABNORMAL HIGH
WBC: 5.9
WBC: 8.7

## 2011-05-16 LAB — IRON AND TIBC
Iron: 40 — ABNORMAL LOW
Saturation Ratios: 24
TIBC: 169 — ABNORMAL LOW
UIBC: 129

## 2011-05-16 LAB — COMPREHENSIVE METABOLIC PANEL
ALT: 24
ALT: 27
AST: 40 — ABNORMAL HIGH
Alkaline Phosphatase: 93
BUN: 88 — ABNORMAL HIGH
CO2: 25
Calcium: 9.1
Chloride: 112
GFR calc Af Amer: 15 — ABNORMAL LOW
GFR calc non Af Amer: 12 — ABNORMAL LOW
Glucose, Bld: 160 — ABNORMAL HIGH
Potassium: 4.5
Sodium: 140
Sodium: 142
Total Bilirubin: 1.1

## 2011-05-16 LAB — CARBOXYHEMOGLOBIN
Carboxyhemoglobin: 2.1 — ABNORMAL HIGH
Carboxyhemoglobin: 2.1 — ABNORMAL HIGH
Carboxyhemoglobin: 2.3 — ABNORMAL HIGH
Methemoglobin: 1
Methemoglobin: 1.4
Methemoglobin: 1.5
O2 Saturation: 73.8
O2 Saturation: 76.2
O2 Saturation: 80.3
O2 Saturation: 83.6
Total hemoglobin: 7.9 — CL
Total hemoglobin: 8.4 — ABNORMAL LOW

## 2011-05-16 LAB — CLOSTRIDIUM DIFFICILE EIA: C difficile Toxins A+B, EIA: 1

## 2011-05-16 LAB — CARDIAC PANEL(CRET KIN+CKTOT+MB+TROPI)
CK, MB: 1.2
CK, MB: 2.3
Relative Index: INVALID
Total CK: 114
Troponin I: 0.47 — ABNORMAL HIGH
Troponin I: 1.27

## 2011-05-16 LAB — DIFFERENTIAL
Eosinophils Absolute: 0
Eosinophils Relative: 0
Lymphs Abs: 1
Monocytes Absolute: 2.6 — ABNORMAL HIGH
Monocytes Relative: 16 — ABNORMAL HIGH

## 2011-05-16 LAB — TYPE AND SCREEN: ABO/RH(D): A POS

## 2011-05-16 LAB — CORTISOL: Cortisol, Plasma: 24.3

## 2011-05-16 LAB — TSH: TSH: 2.698

## 2011-05-16 LAB — URINE MICROSCOPIC-ADD ON

## 2011-05-16 LAB — OCCULT BLOOD X 1 CARD TO LAB, STOOL: Fecal Occult Bld: POSITIVE

## 2011-05-16 LAB — TROPONIN I: Troponin I: 1.9

## 2011-05-16 LAB — LACTIC ACID, PLASMA: Lactic Acid, Venous: 3.6 — ABNORMAL HIGH

## 2011-05-16 LAB — CULTURE, BAL-QUANTITATIVE W GRAM STAIN

## 2011-05-16 LAB — PREPARE RBC (CROSSMATCH)

## 2011-05-16 LAB — CULTURE, BLOOD (ROUTINE X 2)

## 2011-05-16 LAB — MAGNESIUM: Magnesium: 2.2

## 2011-05-16 LAB — POCT CARDIAC MARKERS
Myoglobin, poc: >500
Operator id: 4531

## 2011-05-16 LAB — PROTIME-INR: Prothrombin Time: 17.3 — ABNORMAL HIGH

## 2011-05-16 LAB — VANCOMYCIN, RANDOM: Vancomycin Rm: 8.9

## 2011-05-16 LAB — D-DIMER, QUANTITATIVE: D-Dimer, Quant: 3.65 — ABNORMAL HIGH

## 2011-05-16 LAB — CULTURE, RESPIRATORY W GRAM STAIN: Culture: NORMAL

## 2011-05-16 LAB — PHOSPHORUS: Phosphorus: 3.9

## 2011-05-16 LAB — VITAMIN B12: Vitamin B-12: 1194 — ABNORMAL HIGH (ref 211–911)

## 2011-05-17 LAB — BASIC METABOLIC PANEL
BUN: 12
BUN: 17
BUN: 21
CO2: 24
CO2: 25
Calcium: 8.4
Calcium: 8.6
Chloride: 104
Chloride: 110
Creatinine, Ser: 0.54
Creatinine, Ser: 0.55
Creatinine, Ser: 0.72
GFR calc Af Amer: >60
GFR calc Af Amer: >60
GFR calc Af Amer: >60
GFR calc non Af Amer: >60
GFR calc non Af Amer: >60
GFR calc non Af Amer: >60
Glucose, Bld: 111 — ABNORMAL HIGH
Potassium: 2.9 — ABNORMAL LOW
Potassium: 3.3 — ABNORMAL LOW
Potassium: 3.4 — ABNORMAL LOW
Sodium: 135
Sodium: 140

## 2011-05-17 LAB — CBC
HCT: 29.2 — ABNORMAL LOW
HCT: 30.2 — ABNORMAL LOW
Hemoglobin: 9.4 — ABNORMAL LOW
MCHC: 33.4
MCHC: 33.4
MCV: 84.6
Platelets: 105 — ABNORMAL LOW
Platelets: 184
RDW: 16.8 — ABNORMAL HIGH
RDW: 16.9 — ABNORMAL HIGH
WBC: 10.2

## 2011-05-17 LAB — CULTURE, BAL-QUANTITATIVE W GRAM STAIN
Colony Count: NO GROWTH
Gram Stain: NONE SEEN

## 2011-05-17 LAB — MAGNESIUM
Magnesium: 1.3 — ABNORMAL LOW
Magnesium: 1.9

## 2011-05-17 LAB — COMPREHENSIVE METABOLIC PANEL
ALT: 18
AST: 20
Alkaline Phosphatase: 76
CO2: 24
GFR calc Af Amer: >60
GFR calc non Af Amer: >60
Glucose, Bld: 157 — ABNORMAL HIGH
Potassium: 3 — ABNORMAL LOW

## 2011-05-17 LAB — PROTIME-INR: Prothrombin Time: 14.1

## 2011-05-17 LAB — PHOSPHORUS
Phosphorus: 2.7
Phosphorus: 2.8

## 2011-05-17 LAB — PREALBUMIN: Prealbumin: 18.8

## 2011-07-11 ENCOUNTER — Other Ambulatory Visit (HOSPITAL_COMMUNITY): Payer: Self-pay | Admitting: Internal Medicine

## 2011-07-11 DIAGNOSIS — R1312 Dysphagia, oropharyngeal phase: Secondary | ICD-10-CM

## 2011-07-14 ENCOUNTER — Other Ambulatory Visit (HOSPITAL_COMMUNITY): Payer: Self-pay

## 2011-07-14 ENCOUNTER — Ambulatory Visit (HOSPITAL_COMMUNITY): Payer: Self-pay

## 2011-08-01 IMAGING — CR DG CHEST 1V PORT
1 series · 1 of 1 positions shown · non-contrast
Comparison: 12/26/2007 and earlier.

CLINICAL DATA: 76-year-old female with fever, congestion, cough.

PORTABLE CHEST - 1 VIEW

[view not recorded]
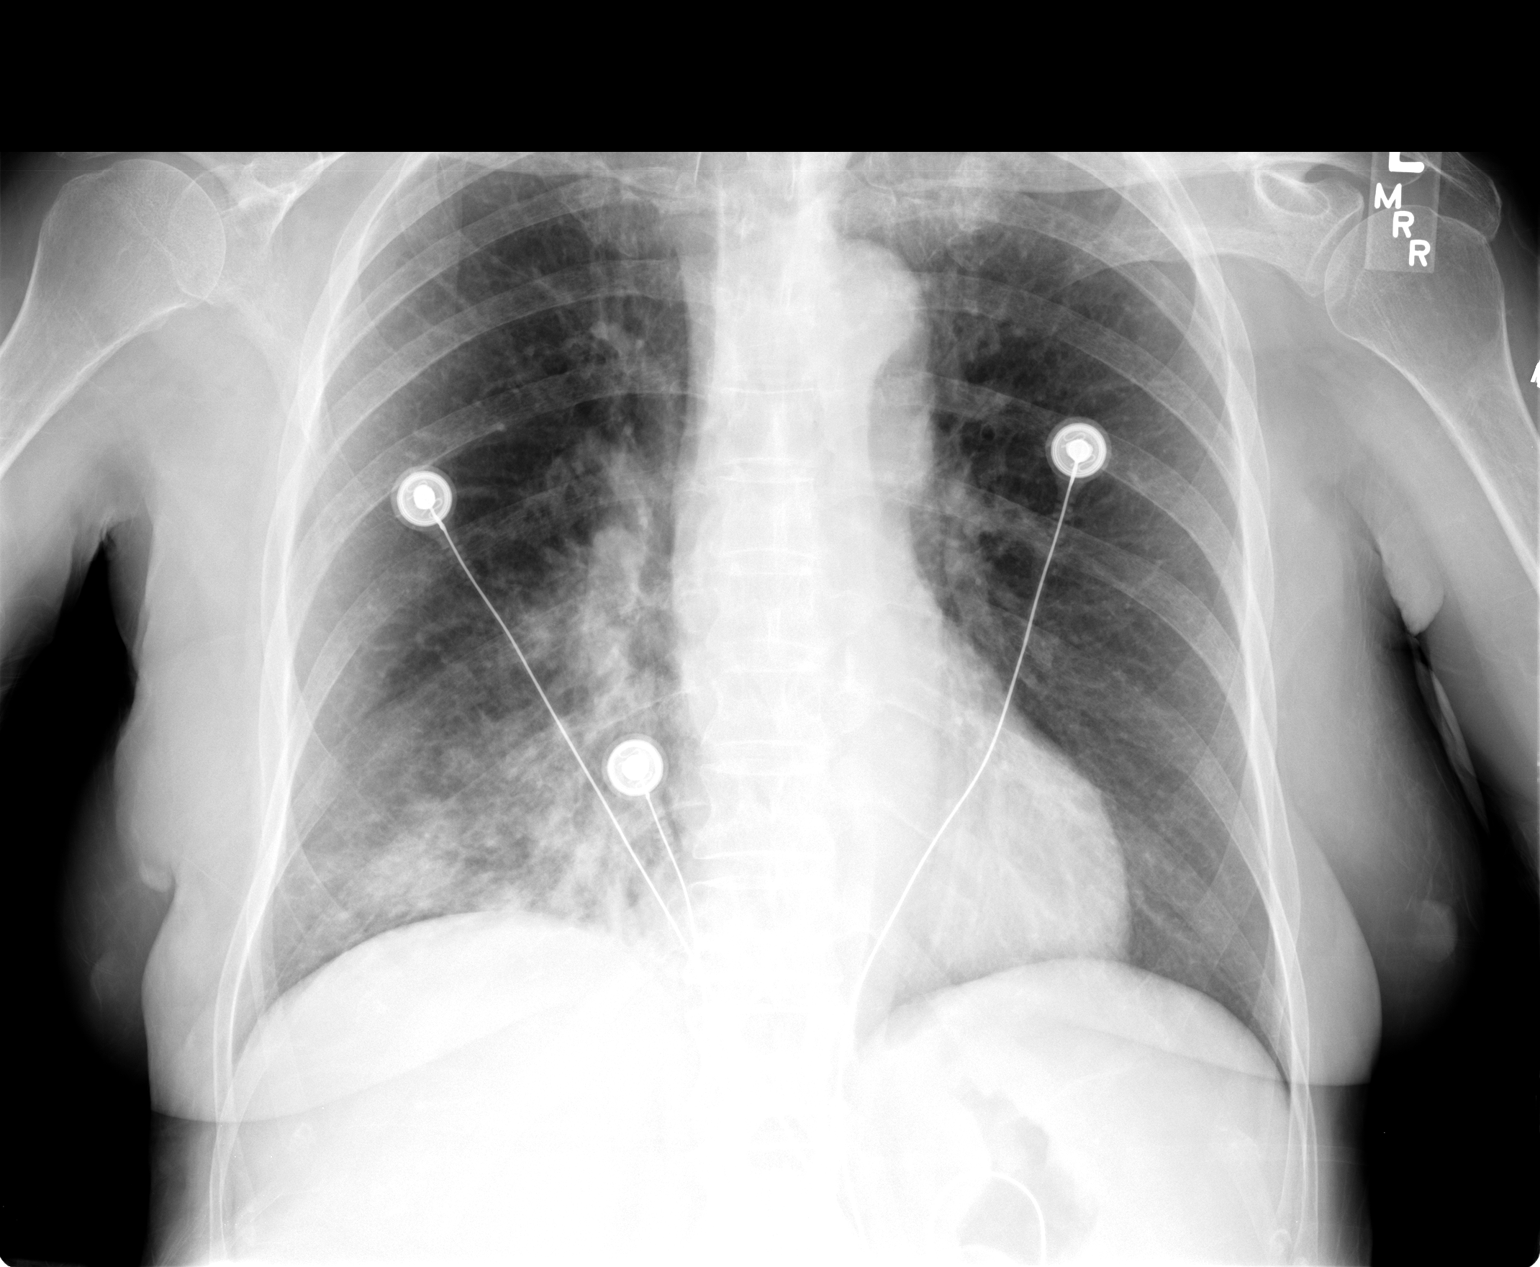

[1 of 1 positions shown; findings below may reference images not displayed]

FINDINGS: AP portable upright view at AP portable upright view at
5416 hours.  Confluent airspace opacity at the right lung base.
Elsewhere, the lungs are clear.  Cardiac size and mediastinal
contours are within normal limits.  No pleural effusion or
pulmonary edema.
IMPRESSION: Right lung base pneumonia.

## 2011-08-08 ENCOUNTER — Other Ambulatory Visit (HOSPITAL_COMMUNITY): Payer: Self-pay | Admitting: Internal Medicine

## 2011-08-08 DIAGNOSIS — R1311 Dysphagia, oral phase: Secondary | ICD-10-CM

## 2011-08-11 ENCOUNTER — Ambulatory Visit (HOSPITAL_COMMUNITY)
Admission: RE | Admit: 2011-08-11 | Discharge: 2011-08-11 | Disposition: A | Discharge status: Home / Self Care | Payer: Medicare Other | Source: Ambulatory Visit | Attending: Internal Medicine | Admitting: Internal Medicine

## 2011-08-11 DIAGNOSIS — R1312 Dysphagia, oropharyngeal phase: Secondary | ICD-10-CM | POA: Insufficient documentation

## 2011-08-11 DIAGNOSIS — R1311 Dysphagia, oral phase: Secondary | ICD-10-CM

## 2011-08-11 DIAGNOSIS — R1319 Other dysphagia: Secondary | ICD-10-CM | POA: Insufficient documentation
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
## 2011-08-14 ENCOUNTER — Emergency Department (HOSPITAL_COMMUNITY): Payer: Medicare Other

## 2011-08-14 ENCOUNTER — Ambulatory Visit (HOSPITAL_COMMUNITY)
Admission: EM | Admit: 2011-08-14 | Discharge: 2011-08-14 | Disposition: A | Discharge status: Skilled Nursing Facility | Payer: Medicare Other | Attending: Emergency Medicine | Admitting: Emergency Medicine

## 2011-08-14 ENCOUNTER — Encounter (HOSPITAL_COMMUNITY)
Admission: EM | Disposition: A | Discharge status: Skilled Nursing Facility | Payer: Self-pay | Source: Home / Self Care | Attending: Emergency Medicine

## 2011-08-14 ENCOUNTER — Encounter (HOSPITAL_COMMUNITY): Payer: Self-pay | Admitting: Adult Health

## 2011-08-14 DIAGNOSIS — R1319 Other dysphagia: Secondary | ICD-10-CM

## 2011-08-14 DIAGNOSIS — I10 Essential (primary) hypertension: Secondary | ICD-10-CM | POA: Insufficient documentation

## 2011-08-14 DIAGNOSIS — K219 Gastro-esophageal reflux disease without esophagitis: Secondary | ICD-10-CM | POA: Insufficient documentation

## 2011-08-14 DIAGNOSIS — IMO0002 Reserved for concepts with insufficient information to code with codable children: Secondary | ICD-10-CM | POA: Insufficient documentation

## 2011-08-14 DIAGNOSIS — Z79899 Other long term (current) drug therapy: Secondary | ICD-10-CM | POA: Insufficient documentation

## 2011-08-14 DIAGNOSIS — K225 Diverticulum of esophagus, acquired: Secondary | ICD-10-CM | POA: Insufficient documentation

## 2011-08-14 DIAGNOSIS — E039 Hypothyroidism, unspecified: Secondary | ICD-10-CM | POA: Insufficient documentation

## 2011-08-14 DIAGNOSIS — Y833 Surgical operation with formation of external stoma as the cause of abnormal reaction of the patient, or of later complication, without mention of misadventure at the time of the procedure: Secondary | ICD-10-CM | POA: Insufficient documentation

## 2011-08-14 DIAGNOSIS — K9423 Gastrostomy malfunction: Secondary | ICD-10-CM

## 2011-08-14 DIAGNOSIS — R633 Feeding difficulties, unspecified: Secondary | ICD-10-CM | POA: Insufficient documentation

## 2011-08-14 DIAGNOSIS — R1310 Dysphagia, unspecified: Secondary | ICD-10-CM | POA: Insufficient documentation

## 2011-08-14 DIAGNOSIS — Z981 Arthrodesis status: Secondary | ICD-10-CM | POA: Insufficient documentation

## 2011-08-14 DIAGNOSIS — T182XXA Foreign body in stomach, initial encounter: Secondary | ICD-10-CM | POA: Insufficient documentation

## 2011-08-14 HISTORY — PX: PEG PLACEMENT: SHX5437

## 2011-08-14 SURGERY — REPLACEMENT, PEG TUBE, WITHOUT ENDOSCOPY
Anesthesia: Moderate Sedation | Site: Abdomen | Wound class: Clean

## 2011-08-14 MED ORDER — SODIUM CHLORIDE 0.9 % IV SOLN: Freq: Once | INTRAVENOUS | Status: DC

## 2011-08-14 MED ORDER — BUTAMBEN-TETRACAINE-BENZOCAINE 2-2-14 % EX AERO
INHALATION_SPRAY | CUTANEOUS | Status: DC | PRN
  Administered 2011-08-14: 2 via TOPICAL

## 2011-08-14 MED ORDER — SODIUM CHLORIDE 0.9 % IV SOLN
Freq: Once | INTRAVENOUS | Status: AC
  Administered 2011-08-14: 500 mL via INTRAVENOUS

## 2011-08-14 MED ORDER — MIDAZOLAM HCL 10 MG/2ML IJ SOLN
INTRAMUSCULAR | Status: DC | PRN
  Administered 2011-08-14: 2 mg via INTRAVENOUS
  Administered 2011-08-14 (×4): 1 mg via INTRAVENOUS

## 2011-08-14 MED ORDER — FENTANYL CITRATE 0.05 MG/ML IJ SOLN
INTRAMUSCULAR | Status: DC | PRN
  Administered 2011-08-14 (×2): 25 ug via INTRAVENOUS

## 2011-08-14 MED ORDER — CEFAZOLIN SODIUM 1-5 GM-% IV SOLN
1.0000 g | Freq: Once | INTRAVENOUS | Status: AC
  Administered 2011-08-14: 1 g via INTRAVENOUS
  Filled 2011-08-14: qty 50
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
## 2011-08-14 NOTE — ED Notes (Signed)
G-tube has not been replaced in 5 years, the tube began leaking this am. Need evaluated. No other complaints.

## 2011-08-14 NOTE — Op Note (Signed)
[facility] [address] [place], [place]  [address]  OPERATIVE PROCEDURE REPORT  PATIENT:  Denise Shepherd, Denise Shepherd  MR#:  [id number] BIRTHDATE:  [birth year], 78 yrs. old  GENDER:  female ENDOSCOPIST:  Judie Petit T. Russella Dar, MD, [facility] PROCEDURE DATE:  08/14/2011 PROCEDURE:  EGD with PEG placement, with foreign body removal ASA CLASS:  Class III INDICATIONS:  1) feeding difficulties  2) dysphagia MEDICATIONS:    These medications were titrated to patient response per physician's verbal order, Fentanyl 50 mcg IV, Versed 6 mg IV TOPICAL ANESTHETIC:  Cetacaine Spray DESCRIPTION OF PROCEDURE:  An attempt was make to remove the leaking and indwelling Gtube percutaeously with EGD or sedtion howver the internal bumper broke free from the rest of the tubing and remained in the stomach. After the risks benefits and alternatives of the procedure were thoroughly explained, informed consent was obtained. The Pentax [id number] endoscope was introduced through the mouth and advanced to the second portion of the duodenum, without limitations. Difficult intubation with the large Zenker's. The instrument was slowly withdrawn as the mucosa was fully examined. A large Zenker's diverticulum was found in the pharynx. The internal bumper of the PEG tube was found in the antrum. It was snared and removed orally. The stomach was entered and closely examined. The pylorus, antrum, angularis, and lesser curvature were well visualized, including a retroflexed view of the cardia and fundus. The stomach wall was normally distensable. The scope passed easily through the pylorus into the duodenum. The gastrostomy site was noted in the body of the stomach with a small blood clot over the opening. The duodenal bulb was normal in appearance, as was the postbulbar duodenum.  The esophagus and gastroesophageal junction were completely normal in appearance. <<PROCEDUREIMAGES>> The stomach was then inflated with air, the  site for the gastrostomy tube placement was in the prior gastrostomy site on the anterior abdominal wall. The skin of the anterior abdomen was surgically prepped and draped with sterile towels.  Utilizing strict sterile technique, the wire was then passed through the abdominal wall and through the anterior wall of the stomach, maintaining visualization with the endoscope.  A snare device previously placed through the instrument channel was then opened and placed around the wire in the stomach lumen. The snare was then pulled up to the endoscope distal tip, and the scope was then withdrawn bringing with it the snare and insertion wire.  The insertion wire was then released from the snare, and then loop-attached to the [employer] Pull PEG gastrostomy tube. Using the "pull technique", the G-tube was then pulled into place by traction on the insertion wire at the abdominal wall end. The G-tube insertion site was then cleansed once again, and the external bolster was placed over the tube to secure it to the abdominal wall.  A sterile dressing was then applied, and the procedure completed.  COMPLICATIONS:  None  ENDOSCOPIC IMPRESSION: 1) Large Zenker's diverticulum 2) Gtube bumper in the antrum 3) PEG tube placement  RECOMMENDATIONS: 1)Begin feeding and meds per G tube post 1800 today  Vertis Scheib T. Russella Dar, MD, [facility]  n. eSIGNED:   Venita Lick. Davie Claud at 08/14/2011 04:22 PM  Cory Munch, [phone]

## 2011-08-14 NOTE — ED Notes (Signed)
Pt reports her g-tube is leaking.

## 2011-08-14 NOTE — Consult Note (Signed)
I have taken a history and examined the patient. I agree with the extenders note, impression and recommendations.  Denise Shepherd. Russella Dar MD [facility]

## 2011-08-14 NOTE — H&P (Signed)
  See consult note from GI service earlier today.
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
## 2011-08-14 NOTE — Procedures (Signed)
PEG replaced. See [facility] note.

## 2011-08-14 NOTE — ED Notes (Signed)
ZOX:WR60<AV> Expected date:08/14/11<BR> Expected time: 9:23 AM<BR> Means of arrival:Ambulance<BR> Comments:<BR> g tube problems

## 2011-08-16 ENCOUNTER — Encounter (HOSPITAL_COMMUNITY): Payer: Self-pay | Admitting: Gastroenterology

## 2011-08-17 ENCOUNTER — Encounter (HOSPITAL_COMMUNITY): Payer: Self-pay

## 2011-09-11 ENCOUNTER — Other Ambulatory Visit (HOSPITAL_BASED_OUTPATIENT_CLINIC_OR_DEPARTMENT_OTHER): Payer: Self-pay | Admitting: Internal Medicine

## 2011-09-11 DIAGNOSIS — Z13820 Encounter for screening for osteoporosis: Secondary | ICD-10-CM

## 2011-09-11 DIAGNOSIS — Z1231 Encounter for screening mammogram for malignant neoplasm of breast: Secondary | ICD-10-CM

## 2011-10-06 ENCOUNTER — Ambulatory Visit (HOSPITAL_COMMUNITY)
Admission: RE | Admit: 2011-10-06 | Discharge: 2011-10-06 | Disposition: A | Discharge status: Home / Self Care | Payer: Medicare Other | Source: Ambulatory Visit | Attending: Internal Medicine | Admitting: Internal Medicine

## 2011-10-06 DIAGNOSIS — Z780 Asymptomatic menopausal state: Secondary | ICD-10-CM | POA: Insufficient documentation

## 2011-10-06 DIAGNOSIS — Z13820 Encounter for screening for osteoporosis: Secondary | ICD-10-CM

## 2011-10-06 DIAGNOSIS — Z1231 Encounter for screening mammogram for malignant neoplasm of breast: Secondary | ICD-10-CM

## 2012-08-09 ENCOUNTER — Emergency Department: Payer: Self-pay | Admitting: Emergency Medicine

## 2012-10-18 ENCOUNTER — Ambulatory Visit: Payer: Self-pay | Admitting: Gastroenterology

## 2013-01-20 ENCOUNTER — Other Ambulatory Visit: Payer: Self-pay | Admitting: Family Medicine

## 2013-01-20 LAB — CBC WITH DIFFERENTIAL/PLATELET
Basophil %: 0.5 %
HCT: 36.4 % (ref 35.0–47.0)
HGB: 12.4 g/dL (ref 12.0–16.0)
Lymphocyte #: 1.2 10*3/uL (ref 1.0–3.6)
Lymphocyte %: 24.7 %
MCHC: 34 g/dL (ref 32.0–36.0)
Neutrophil %: 62.2 %
Platelet: 164 10*3/uL (ref 150–440)
RBC: 4.1 10*6/uL (ref 3.80–5.20)
RDW: 14.1 % (ref 11.5–14.5)
WBC: 4.9 10*3/uL (ref 3.6–11.0)

## 2013-01-20 LAB — COMPREHENSIVE METABOLIC PANEL
Albumin: 3 g/dL — ABNORMAL LOW (ref 3.4–5.0)
Alkaline Phosphatase: 70 U/L (ref 50–136)
BUN: 23 mg/dL — ABNORMAL HIGH (ref 7–18)
Bilirubin,Total: 0.3 mg/dL (ref 0.2–1.0)
Calcium, Total: 9 mg/dL (ref 8.5–10.1)
Co2: 32 mmol/L (ref 21–32)
EGFR (Non-African Amer.): >60
Glucose: 67 mg/dL (ref 65–99)
Osmolality: 285 (ref 275–301)
Potassium: 3.8 mmol/L (ref 3.5–5.1)
SGOT(AST): 21 U/L (ref 15–37)
SGPT (ALT): 17 U/L (ref 12–78)

## 2013-01-20 LAB — LIPID PANEL
Cholesterol: 128 mg/dL (ref 0–200)
HDL Cholesterol: 35 mg/dL — ABNORMAL LOW (ref 40–60)
Triglycerides: 117 mg/dL (ref 0–200)
VLDL Cholesterol, Calc: 23 mg/dL (ref 5–40)

## 2013-01-20 LAB — TSH: Thyroid Stimulating Horm: 0.43 u[IU]/mL — ABNORMAL LOW

## 2013-04-08 ENCOUNTER — Ambulatory Visit: Payer: Self-pay | Admitting: Gastroenterology

## 2013-05-08 ENCOUNTER — Other Ambulatory Visit: Payer: Self-pay | Admitting: Family Medicine

## 2013-05-09 LAB — WBCS, STOOL

## 2013-05-10 LAB — STOOL CULTURE

## 2013-05-27 ENCOUNTER — Other Ambulatory Visit: Payer: Self-pay | Admitting: Family Medicine

## 2013-05-27 LAB — CLOSTRIDIUM DIFFICILE BY PCR

## 2013-05-30 ENCOUNTER — Ambulatory Visit: Payer: Self-pay | Admitting: Gastroenterology

## 2013-06-02 LAB — PATHOLOGY REPORT

## 2013-08-07 ENCOUNTER — Other Ambulatory Visit: Payer: Self-pay | Admitting: Family Medicine

## 2013-08-07 LAB — CBC WITH DIFFERENTIAL/PLATELET
Basophil #: 0 10*3/uL (ref 0.0–0.1)
Basophil %: 0.2 %
Eosinophil #: 0 10*3/uL (ref 0.0–0.7)
HCT: 38.3 % (ref 35.0–47.0)
HGB: 12.7 g/dL (ref 12.0–16.0)
Lymphocyte #: 0.9 10*3/uL — ABNORMAL LOW (ref 1.0–3.6)
MCV: 89 fL (ref 80–100)
Monocyte #: 0.7 x10 3/mm (ref 0.2–0.9)
Monocyte %: 5.4 %
Neutrophil #: 11.7 10*3/uL — ABNORMAL HIGH (ref 1.4–6.5)
Neutrophil %: 87.9 %
RBC: 4.32 10*6/uL (ref 3.80–5.20)
RDW: 13.9 % (ref 11.5–14.5)
WBC: 13.4 10*3/uL — ABNORMAL HIGH (ref 3.6–11.0)

## 2013-08-07 LAB — COMPREHENSIVE METABOLIC PANEL
Albumin: 2.6 g/dL — ABNORMAL LOW (ref 3.4–5.0)
Anion Gap: 9 (ref 7–16)
BUN: 66 mg/dL — ABNORMAL HIGH (ref 7–18)
Bilirubin,Total: 2.2 mg/dL — ABNORMAL HIGH (ref 0.2–1.0)
Chloride: 94 mmol/L — ABNORMAL LOW (ref 98–107)
Co2: 31 mmol/L (ref 21–32)
EGFR (African American): 46 — ABNORMAL LOW
Osmolality: 287 (ref 275–301)
SGOT(AST): 37 U/L (ref 15–37)
Sodium: 134 mmol/L — ABNORMAL LOW (ref 136–145)

## 2013-08-07 LAB — BASIC METABOLIC PANEL
Anion Gap: 8 (ref 7–16)
Creatinine: 1.16 mg/dL (ref 0.60–1.30)
EGFR (Non-African Amer.): 45 — ABNORMAL LOW
Glucose: 100 mg/dL — ABNORMAL HIGH (ref 65–99)
Potassium: 3.3 mmol/L — ABNORMAL LOW (ref 3.5–5.1)
Sodium: 132 mmol/L — ABNORMAL LOW (ref 136–145)

## 2013-08-07 LAB — CBC
MCV: 89 fL (ref 80–100)
Platelet: 225 10*3/uL (ref 150–440)
RBC: 4.38 10*6/uL (ref 3.80–5.20)

## 2013-08-08 ENCOUNTER — Inpatient Hospital Stay: Payer: Self-pay | Admitting: Surgery

## 2013-08-09 LAB — CBC WITH DIFFERENTIAL/PLATELET
Basophil #: 0 10*3/uL (ref 0.0–0.1)
Basophil %: 0.1 %
Eosinophil #: 0 10*3/uL (ref 0.0–0.7)
Eosinophil %: 0.1 %
HCT: 36.9 % (ref 35.0–47.0)
Lymphocyte #: 0.3 10*3/uL — ABNORMAL LOW (ref 1.0–3.6)
MCH: 29.5 pg (ref 26.0–34.0)
MCHC: 33.2 g/dL (ref 32.0–36.0)
MCV: 89 fL (ref 80–100)
Monocyte #: 0.3 x10 3/mm (ref 0.2–0.9)
Neutrophil #: 6.8 10*3/uL — ABNORMAL HIGH (ref 1.4–6.5)
Platelet: 234 10*3/uL (ref 150–440)
RBC: 4.15 10*6/uL (ref 3.80–5.20)
RDW: 14.3 % (ref 11.5–14.5)
WBC: 7.4 10*3/uL (ref 3.6–11.0)

## 2013-08-09 LAB — BASIC METABOLIC PANEL
Anion Gap: 6 — ABNORMAL LOW (ref 7–16)
BUN: 29 mg/dL — ABNORMAL HIGH (ref 7–18)
Calcium, Total: 8.2 mg/dL — ABNORMAL LOW (ref 8.5–10.1)
Chloride: 104 mmol/L (ref 98–107)
Co2: 30 mmol/L (ref 21–32)
Creatinine: 0.85 mg/dL (ref 0.60–1.30)
EGFR (Non-African Amer.): >60
Potassium: 3.8 mmol/L (ref 3.5–5.1)
Sodium: 140 mmol/L (ref 136–145)

## 2013-08-10 LAB — BASIC METABOLIC PANEL
BUN: 22 mg/dL — ABNORMAL HIGH (ref 7–18)
Creatinine: 0.69 mg/dL (ref 0.60–1.30)
EGFR (African American): >60
EGFR (Non-African Amer.): >60
Osmolality: 293 (ref 275–301)
Potassium: 3.4 mmol/L — ABNORMAL LOW (ref 3.5–5.1)

## 2013-08-10 LAB — CBC WITH DIFFERENTIAL/PLATELET
Basophil #: 0 10*3/uL (ref 0.0–0.1)
Basophil %: 0.1 %
HCT: 27.9 % — ABNORMAL LOW (ref 35.0–47.0)
HGB: 9.3 g/dL — ABNORMAL LOW (ref 12.0–16.0)
Lymphocyte #: 0.4 10*3/uL — ABNORMAL LOW (ref 1.0–3.6)
Lymphocyte %: 5 %
MCH: 29.8 pg (ref 26.0–34.0)
MCHC: 33.2 g/dL (ref 32.0–36.0)
Monocyte #: 0.6 x10 3/mm (ref 0.2–0.9)
Monocyte %: 6.6 %
Neutrophil #: 7.9 10*3/uL — ABNORMAL HIGH (ref 1.4–6.5)
Neutrophil %: 87.8 %
RBC: 3.12 10*6/uL — ABNORMAL LOW (ref 3.80–5.20)
WBC: 9 10*3/uL (ref 3.6–11.0)

## 2013-08-11 LAB — CBC WITH DIFFERENTIAL/PLATELET
Basophil #: 0 10*3/uL (ref 0.0–0.1)
Basophil %: 0.1 %
Eosinophil #: 0.1 10*3/uL (ref 0.0–0.7)
HCT: 26.4 % — ABNORMAL LOW (ref 35.0–47.0)
HGB: 8.7 g/dL — ABNORMAL LOW (ref 12.0–16.0)
Lymphocyte #: 0.6 10*3/uL — ABNORMAL LOW (ref 1.0–3.6)
Lymphocyte %: 6.1 %
MCH: 29.7 pg (ref 26.0–34.0)
MCHC: 33.1 g/dL (ref 32.0–36.0)
Monocyte #: 0.4 x10 3/mm (ref 0.2–0.9)
Monocyte %: 4.4 %
Neutrophil #: 8.1 10*3/uL — ABNORMAL HIGH (ref 1.4–6.5)
Platelet: 183 10*3/uL (ref 150–440)
RDW: 14.5 % (ref 11.5–14.5)

## 2013-08-11 LAB — BASIC METABOLIC PANEL
Anion Gap: 2 — ABNORMAL LOW (ref 7–16)
BUN: 14 mg/dL (ref 7–18)
Calcium, Total: 8.3 mg/dL — ABNORMAL LOW (ref 8.5–10.1)
Co2: 32 mmol/L (ref 21–32)
EGFR (African American): >60
Potassium: 3.6 mmol/L (ref 3.5–5.1)
Sodium: 140 mmol/L (ref 136–145)

## 2013-08-12 LAB — HEPATIC FUNCTION PANEL A (ARMC)
Albumin: 1.2 g/dL — ABNORMAL LOW (ref 3.4–5.0)
Alkaline Phosphatase: 61 U/L
Bilirubin, Direct: 0.1 mg/dL (ref 0.00–0.20)
Bilirubin,Total: 0.3 mg/dL (ref 0.2–1.0)
SGPT (ALT): 17 U/L (ref 12–78)
Total Protein: 4.8 g/dL — ABNORMAL LOW (ref 6.4–8.2)

## 2013-08-12 LAB — BASIC METABOLIC PANEL
Calcium, Total: 8 mg/dL — ABNORMAL LOW (ref 8.5–10.1)
Chloride: 104 mmol/L (ref 98–107)
Co2: 30 mmol/L (ref 21–32)
EGFR (African American): >60
EGFR (Non-African Amer.): >60
Potassium: 4 mmol/L (ref 3.5–5.1)
Sodium: 137 mmol/L (ref 136–145)

## 2013-08-12 LAB — CBC WITH DIFFERENTIAL/PLATELET
Basophil #: 0 10*3/uL (ref 0.0–0.1)
Basophil %: 0.3 %
Eosinophil #: 0.1 10*3/uL (ref 0.0–0.7)
Lymphocyte #: 0.5 10*3/uL — ABNORMAL LOW (ref 1.0–3.6)
Lymphocyte %: 5.4 %
MCHC: 33.8 g/dL (ref 32.0–36.0)
MCV: 89 fL (ref 80–100)
Monocyte #: 0.2 x10 3/mm (ref 0.2–0.9)
Monocyte %: 2.5 %
Platelet: 195 10*3/uL (ref 150–440)
RBC: 3.1 10*6/uL — ABNORMAL LOW (ref 3.80–5.20)
RDW: 14.2 % (ref 11.5–14.5)
WBC: 10.1 10*3/uL (ref 3.6–11.0)

## 2013-08-13 LAB — CULTURE, BLOOD (SINGLE)

## 2013-08-13 LAB — PATHOLOGY REPORT

## 2013-08-14 LAB — WBC: WBC: 16.6 10*3/uL — ABNORMAL HIGH (ref 3.6–11.0)

## 2013-08-15 LAB — CBC WITH DIFFERENTIAL/PLATELET
Basophil #: 0 10*3/uL (ref 0.0–0.1)
Eosinophil #: 0.1 10*3/uL (ref 0.0–0.7)
Eosinophil %: 0.7 %
HCT: 25.8 % — ABNORMAL LOW (ref 35.0–47.0)
HGB: 8.6 g/dL — ABNORMAL LOW (ref 12.0–16.0)
Lymphocyte #: 0.6 10*3/uL — ABNORMAL LOW (ref 1.0–3.6)
Lymphocyte %: 4.4 %
MCH: 29.7 pg (ref 26.0–34.0)
MCHC: 33.3 g/dL (ref 32.0–36.0)
MCV: 89 fL (ref 80–100)
Monocyte %: 5.5 %
Platelet: 217 10*3/uL (ref 150–440)
WBC: 14.7 10*3/uL — ABNORMAL HIGH (ref 3.6–11.0)

## 2013-08-16 LAB — CREATININE, SERUM: EGFR (Non-African Amer.): >60

## 2013-08-20 LAB — CREATININE, SERUM
EGFR (African American): >60
EGFR (Non-African Amer.): >60

## 2013-09-18 ENCOUNTER — Other Ambulatory Visit: Payer: Self-pay

## 2013-09-18 LAB — CLOSTRIDIUM DIFFICILE(ARMC)

## 2013-12-01 ENCOUNTER — Other Ambulatory Visit: Payer: Self-pay | Admitting: Family Medicine

## 2013-12-01 LAB — CBC WITH DIFFERENTIAL/PLATELET
BASOS PCT: 0 %
Basophil #: 0 10*3/uL (ref 0.0–0.1)
EOS ABS: 0 10*3/uL (ref 0.0–0.7)
Eosinophil %: 0.1 %
HCT: 31.9 % — ABNORMAL LOW (ref 35.0–47.0)
HGB: 10 g/dL — ABNORMAL LOW (ref 12.0–16.0)
LYMPHS PCT: 3.8 %
Lymphocyte #: 0.5 10*3/uL — ABNORMAL LOW (ref 1.0–3.6)
MCH: 26.7 pg (ref 26.0–34.0)
MCHC: 31.2 g/dL — ABNORMAL LOW (ref 32.0–36.0)
MCV: 85 fL (ref 80–100)
MONOS PCT: 2.6 %
Monocyte #: 0.3 x10 3/mm (ref 0.2–0.9)
NEUTROS ABS: 11.6 10*3/uL — AB (ref 1.4–6.5)
Neutrophil %: 93.5 %
Platelet: 337 10*3/uL (ref 150–440)
RBC: 3.73 10*6/uL — ABNORMAL LOW (ref 3.80–5.20)
RDW: 16.4 % — ABNORMAL HIGH (ref 11.5–14.5)
WBC: 12.4 10*3/uL — ABNORMAL HIGH (ref 3.6–11.0)

## 2013-12-01 LAB — COMPREHENSIVE METABOLIC PANEL
ALK PHOS: 146 U/L — AB
ALT: 10 U/L — AB (ref 12–78)
ANION GAP: 6 — AB (ref 7–16)
Albumin: 1.3 g/dL — ABNORMAL LOW (ref 3.4–5.0)
BUN: 13 mg/dL (ref 7–18)
Bilirubin,Total: 0.2 mg/dL (ref 0.2–1.0)
CHLORIDE: 97 mmol/L — AB (ref 98–107)
CO2: 31 mmol/L (ref 21–32)
CREATININE: 0.65 mg/dL (ref 0.60–1.30)
Calcium, Total: 7.8 mg/dL — ABNORMAL LOW (ref 8.5–10.1)
EGFR (African American): >60
EGFR (Non-African Amer.): >60
GLUCOSE: 96 mg/dL (ref 65–99)
Osmolality: 268 (ref 275–301)
Potassium: 3.9 mmol/L (ref 3.5–5.1)
SGOT(AST): 12 U/L — ABNORMAL LOW (ref 15–37)
Sodium: 134 mmol/L — ABNORMAL LOW (ref 136–145)
Total Protein: 5.9 g/dL — ABNORMAL LOW (ref 6.4–8.2)

## 2013-12-19 DEATH — deceased

## 2014-12-11 NOTE — Op Note (Signed)
PATIENT NAME:  Denise Shepherd, Denise Shepherd MR#:  [id number] DATE OF BIRTH:  1933-05-28  DATE OF PROCEDURE:  08/08/2013  PREOPERATIVE DIAGNOSIS: Complete small bowel obstruction.   POSTOPERATIVE DIAGNOSIS:  Complete small bowel obstruction with strangulated ischemic internal hernia.   PROCEDURES PERFORMED:  1.  Exploratory laparotomy.  2.  Lysis of adhesions.  3.  Small bowel and large bowel resection with primary reanastomosis.  4.  Placement of wound VAC, less than 50 square centimeters. SURGEON: Sherri Rad, M.D.   ASSISTANT: Scrub tech.  ANESTHESIA: General endotracheal.   FINDINGS: There was an internal hernia with ischemic ileum several centimeters proximal to the previous ileotransverse colostomy.   SPECIMENS: As described above.   ESTIMATED BLOOD LOSS: 250 mL.   DRAINS: None.   LAP AND NEEDLE COUNT: Correct x 2.   DESCRIPTION OF PROCEDURE:  With informed consent position, supine position, general endotracheal anesthesia was induced.  The patient's abdomen was widely prepped and draped utilizing ChloraPrep solution. Timeout was observed. The previous midline scar was opened with scalpel and carried down through musculofascial layers to the peritoneum. Adhesiolysis of the omentum was taken down off of the anterior abdominal wall with electrocautery. Self-retaining abdominal wall retractor was placed. Small bowel was massively dilated with the G-tube present in the left upper quadrant in secure position. I then followed the small bowel down to what appeared to be twisting, just several centimeters below the previous ileocolonic anastomosis. Adhesiolysis was performed, releasing the ischemic and necrotic bowel. Bowel resection was then performed utilizing a GIA 75 staplers on either side of the necrotic segment. Intervening mesentery was then sequentially scored, clamped, cut and tied, utilizing #0 Vicryl suture. I elected to perform a new ileal to transverse colostomy and this was accomplished  utilizing seromuscular sutures of 3-0 silk aligning the antimesenteric borders of the bowel. The corner of each staple line was excised. GIA 75 stapler was then introduced. Common channel enterotomy was then fashioned. TA-60 was then used to close the end of the bowel. This first anastomosis, staples did not hold into somewhat inflamed tissue, and, as such, I took this down and resected this anastomosis including the mesentery and performed a second anastomosis in the identical fashion with good result. The anti-traction stitches were placed at the crotch of the anastomosis of 3-0 silks. The mesenteric defect was then obliterated utilizing a running locking 3-0 chromic suture. 5 mL of Evicel was placed over the anastomosis at the completion, followed by tongue of omentum. The abdomen, at this point, was copiously irrigated and aspirated dry. The fascia was reapproximated after placement of Seprafilm to the abdomen utilizing running #1 looped PDS suture. Skin is reapproximated utilizing a skin stapler. An incisional wound VAC was then placed.    ____________________________ Jeannette How. Marina Gravel, MD mab:NTS D: 08/09/2013 16:03:58 ET T: 08/10/2013 02:43:02 ET JOB#: [id number]  cc: Elta Guadeloupe A. Marina Gravel, MD, <Dictator> Hortencia Conradi MD ELECTRONICALLY SIGNED 08/15/2013 11:15
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
## 2014-12-11 NOTE — Discharge Summary (Signed)
ADDENDUM  PATIENT NAME:  Denise Shepherd, Denise Shepherd MR#:  [id number] DATE OF BIRTH:  1933-02-15  DATE OF ADMISSION:  08/08/2013 DATE OF DISCHARGE: 12/ 31/2014   See Dr. Rolin Barry discharge summary from December 29. Denise Shepherd, on the previous intended date of discharge, developed a wound infection, which needed to be opened and began on p.o. antibiotics. Currently, her wound is clean, dry, intact and she is tolerating wet-to-dry dressing changes t.i.d. She also had some loose stools, which have resolved spontaneously. She is to follow up with [facility] surgical as instructed in 1 to 2 weeks. In addition to her previous reconciled medications, she is to take Augmentin 875/125 t.i.d. x 3 more days.  ____________________________ Glena Norfolk Gunda Maqueda, MD cal:aw D: 08/20/2013 46:95:07 ET T: 08/20/2013 07:54:39 ET JOB#: [id number]  cc: Harrell Gave A. Yue Flanigan, MD, <Dictator> Floyde Parkins MD ELECTRONICALLY SIGNED 08/20/2013 16:11

## 2014-12-12 NOTE — Discharge Summary (Signed)
PATIENT NAME:  Denise Shepherd, Denise Shepherd MR#:  [id number] DATE OF BIRTH:  Dec 21, 1932  DATE OF ADMISSION:  08/08/2013 DATE OF DISCHARGE:  08/18/2013  DISPOSITION:  Transfer to [facility] on Monday, the 29th.   BRIEF HISTORY: [name] is a 81 year old woman admitted with appeared to be a significant bowel obstruction. She had marked nausea and vomiting. She had had a PEG tube replaced in the past. She had not been eating well prior to the development of this problem. She had a high-grade bowel obstruction identified on CT scan and was begun on nasogastric suction. Her symptoms did not resolve and she was taken to the Operating Room on the afternoon of 08/08/2013. She underwent a laparotomy, small bowel resection for necrotic bowel. She had very, very slow return of bowel function but has continued to improve over the last several days. She is able to tolerate a diet at this point. We are restarting her G-tube feedings.   DISCHARGE MEDICATIONS: Include Claritin 10 mg p.o. daily, guaifenesin 100 mg in 5 mL oral liquids 20 mL q.6 hours p.r.n., Florastor 250 mg 2 capsules b.i.d., acetaminophen/ hydrocodone 5/325 every 6 hours p.r.n., Jevity 1.5 calorie bolus feeding 237 mg once a day and levothyroxine 0.125 mg p.o. daily.   FOLLOWUP:  She will be transferred back to [facility] and follow in our office in 7 to 10 days' time.    ____________________________ Rodena Goldmann III, MD rle:cs D: 08/17/2013 09:58:50 ET T: 08/17/2013 19:14:51 ET JOB#: [id number]  cc: Rodena Goldmann III, MD, <Dictator> Rodena Goldmann MD ELECTRONICALLY SIGNED 08/21/2013 16:20

## 2014-12-20 IMAGING — CR NECK SOFT TISSUES - 1+ VIEW
1 series · 3 of 3 positions shown · non-contrast
Comparison: none

REASON FOR EXAM: possible foregin body
COMMENTS:

[Series 1: ap · 0.17mm/px · 3 of 3 slices shown]
[im 1/3]
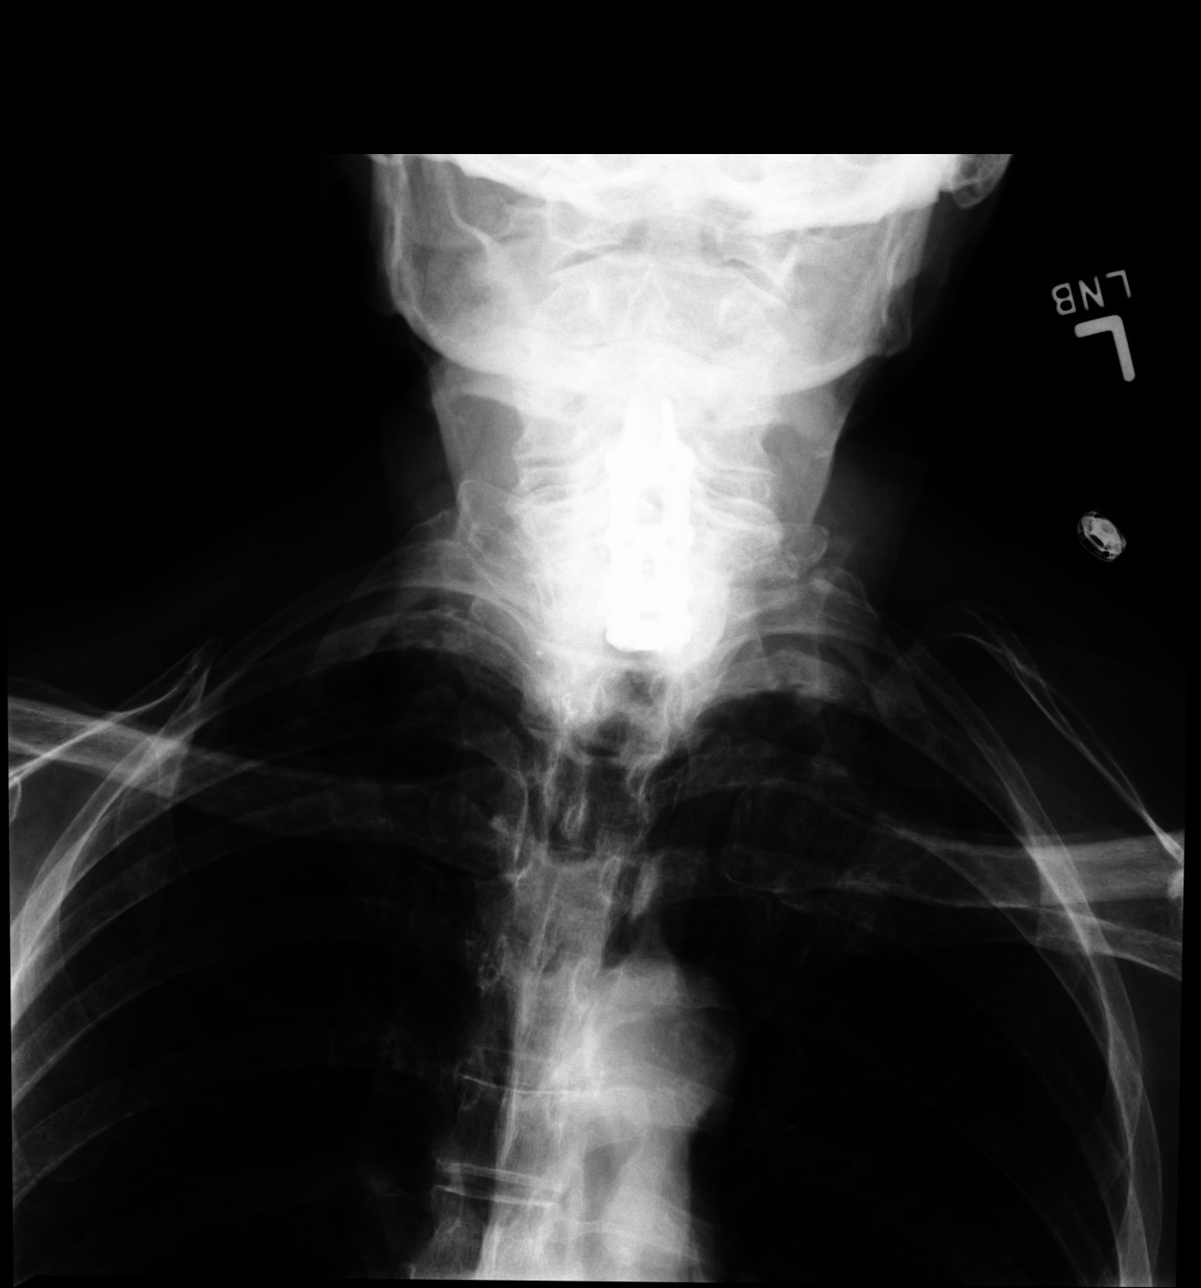
[im 2/3]
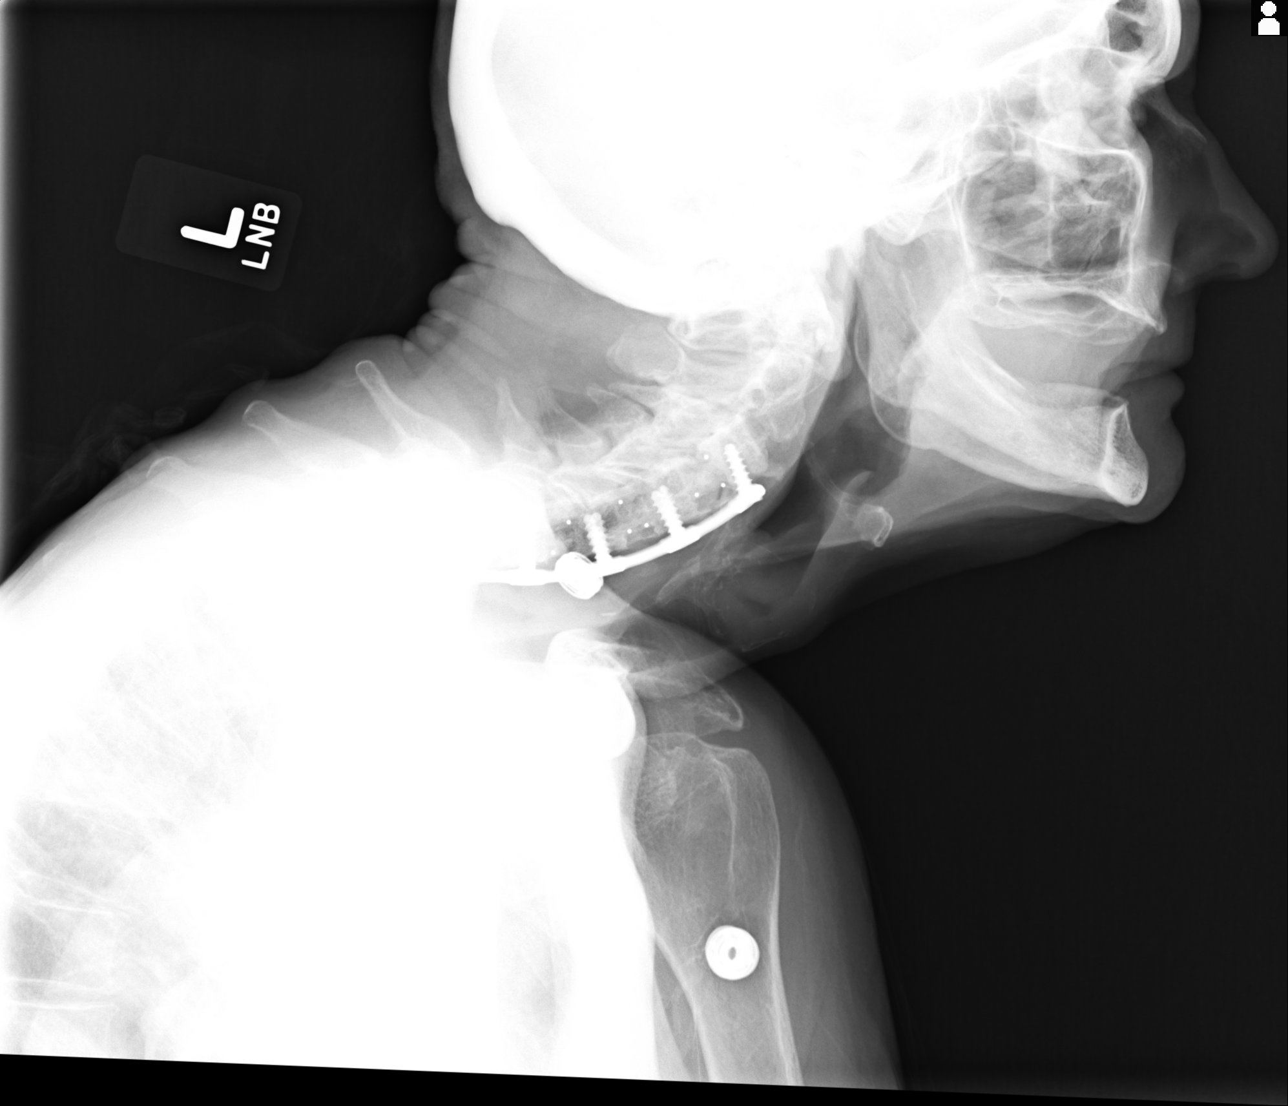
[im 3/3]
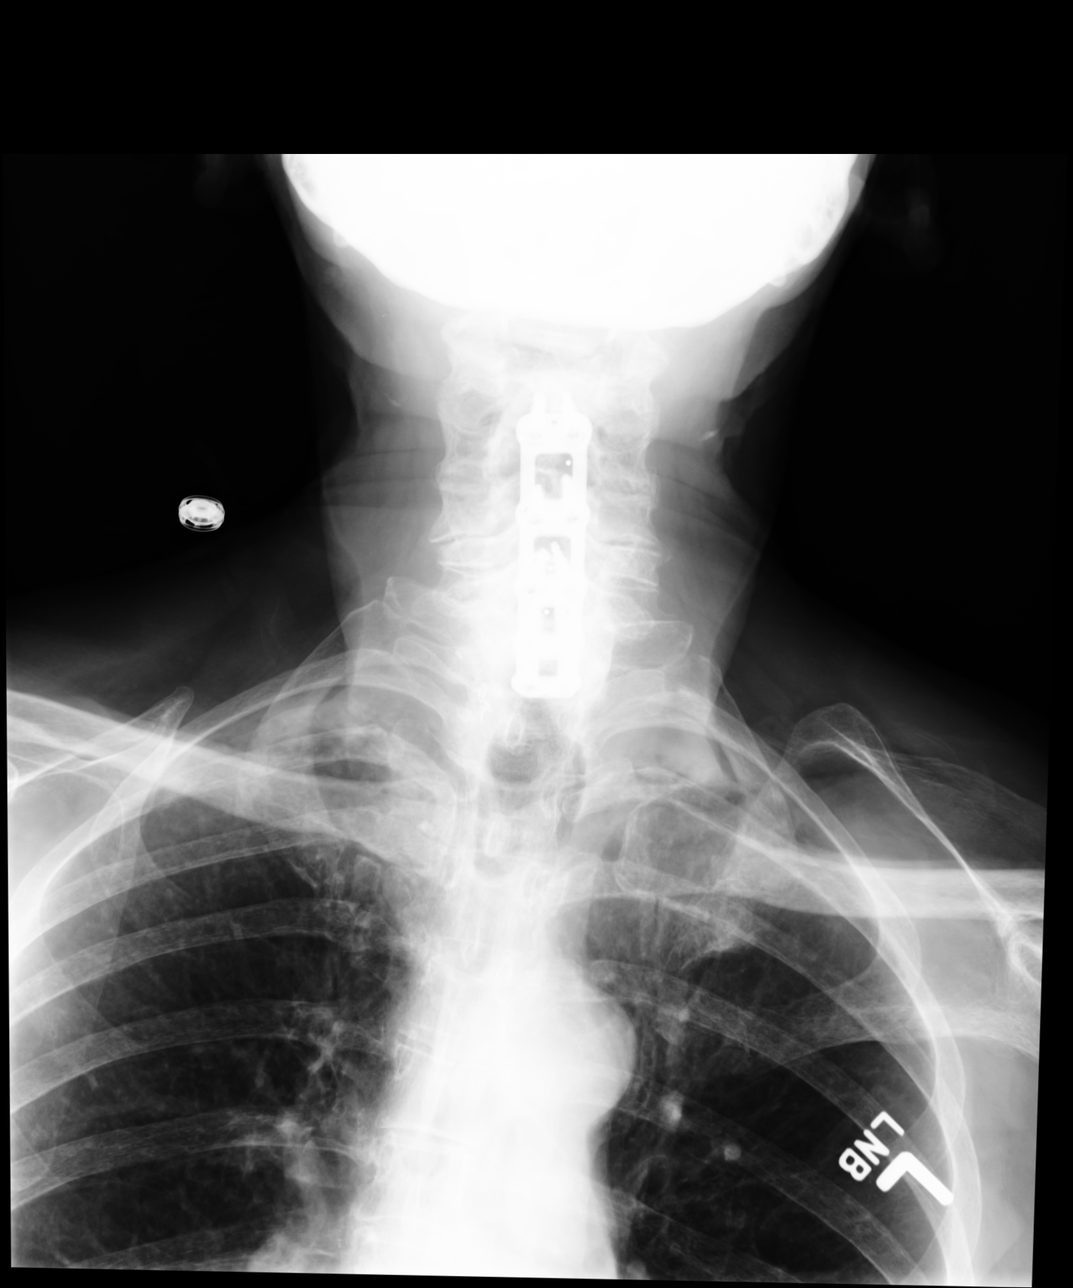

[3 of 3 positions shown; findings below may reference images not displayed]

PROCEDURE:     DXR - DXR SOFT TISSUE NECK  - April 08, 2013  [DATE]

RESULT:     The patient is undergone previous anterior cervical fusion. A
plate with multiple screws spans multiple disc spaces from C2 to
approximately C7. No definite other foreign body is seen aside from snaps on
the patient scanned.
IMPRESSION: Please see above.

[REDACTED]

## 2014-12-20 IMAGING — CR DG CHEST 1V
1 series · 1 of 1 positions shown · non-contrast
Comparison: none

REASON FOR EXAM: possible foregin body
COMMENTS:

[ap]
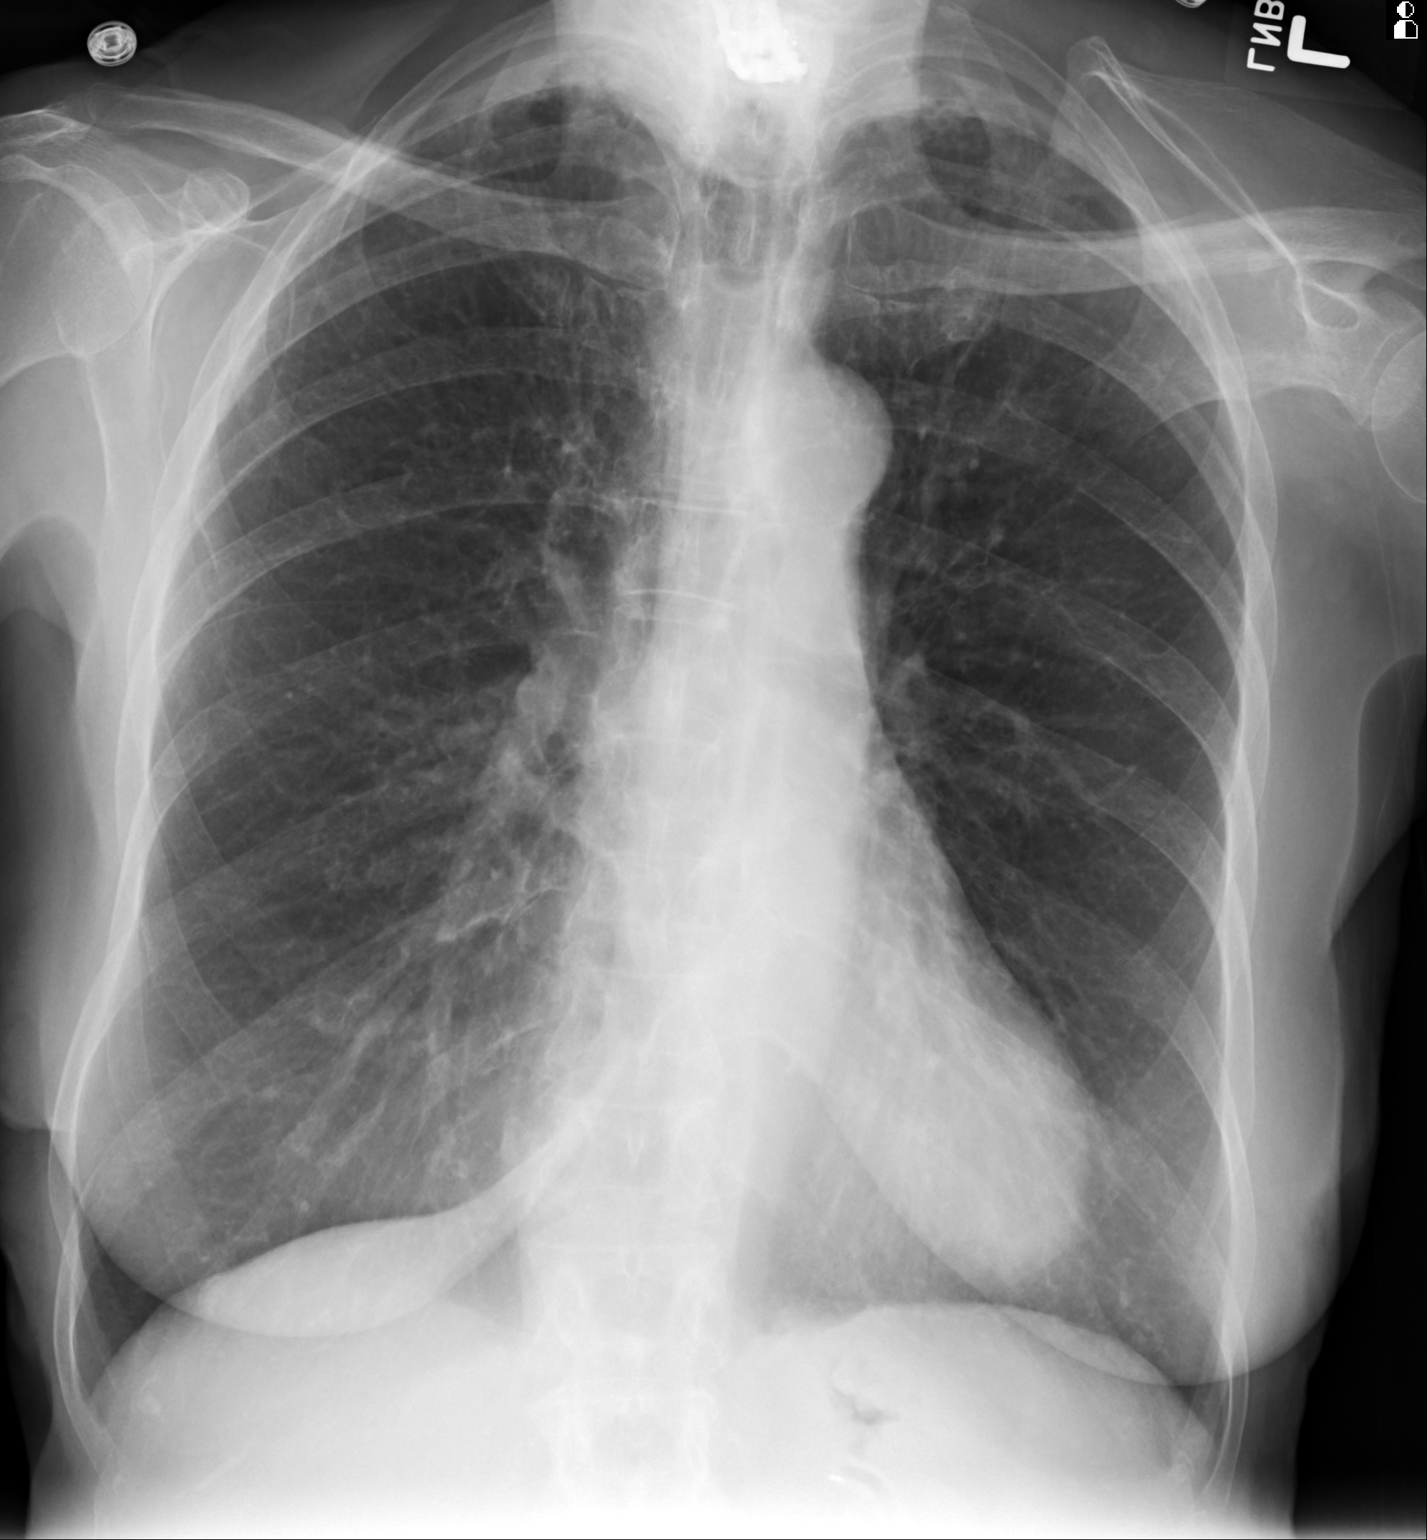

[1 of 1 positions shown; findings below may reference images not displayed]

PROCEDURE:     DXR - DXR CHEST 1 VIEWAP OR PA  - April 08, 2013  [DATE]

RESULT:     The lungs are hyperinflated consistent with COPD. Bilateral
apical pleural and parenchymal fibrotic changes are present. There is no
edema, infiltrate, effusion, mass or pneumothorax. The cardiac silhouette is
normal.
IMPRESSION: 1. COPD with fibrotic changes.

[REDACTED]
